# Patient Record
Sex: Female | Born: 1945 | Race: White | Hispanic: No | Smoking: Former smoker
Health system: Southern US, Community
[De-identification: ages and names within clinical notes are randomized; demographics above are authoritative.]

## PROBLEM LIST (undated history)

## (undated) MED FILL — Ferumoxytol Inj 510 MG/17ML (30 MG/ML) (Elemental Fe): INTRAVENOUS | Qty: 17 | Status: AC

---

## 2005-02-17 ENCOUNTER — Ambulatory Visit (HOSPITAL_COMMUNITY): Admission: RE | Admit: 2005-02-17 | Discharge: 2005-02-18 | Payer: Self-pay | Admitting: Neurological Surgery

## 2013-09-13 DIAGNOSIS — E119 Type 2 diabetes mellitus without complications: Secondary | ICD-10-CM | POA: Diagnosis not present

## 2013-09-13 DIAGNOSIS — Z23 Encounter for immunization: Secondary | ICD-10-CM | POA: Diagnosis not present

## 2013-09-13 DIAGNOSIS — E538 Deficiency of other specified B group vitamins: Secondary | ICD-10-CM | POA: Diagnosis not present

## 2013-09-13 DIAGNOSIS — D649 Anemia, unspecified: Secondary | ICD-10-CM | POA: Diagnosis not present

## 2013-09-20 DIAGNOSIS — Z1231 Encounter for screening mammogram for malignant neoplasm of breast: Secondary | ICD-10-CM | POA: Diagnosis not present

## 2013-10-16 DIAGNOSIS — I10 Essential (primary) hypertension: Secondary | ICD-10-CM | POA: Diagnosis not present

## 2013-10-16 DIAGNOSIS — F3289 Other specified depressive episodes: Secondary | ICD-10-CM | POA: Diagnosis not present

## 2013-10-16 DIAGNOSIS — M545 Low back pain, unspecified: Secondary | ICD-10-CM | POA: Diagnosis not present

## 2013-10-16 DIAGNOSIS — K2100 Gastro-esophageal reflux disease with esophagitis, without bleeding: Secondary | ICD-10-CM | POA: Diagnosis not present

## 2013-10-16 DIAGNOSIS — E538 Deficiency of other specified B group vitamins: Secondary | ICD-10-CM | POA: Diagnosis not present

## 2013-10-16 DIAGNOSIS — F329 Major depressive disorder, single episode, unspecified: Secondary | ICD-10-CM | POA: Diagnosis not present

## 2013-11-19 DIAGNOSIS — E538 Deficiency of other specified B group vitamins: Secondary | ICD-10-CM | POA: Diagnosis not present

## 2013-11-28 DIAGNOSIS — M2550 Pain in unspecified joint: Secondary | ICD-10-CM | POA: Diagnosis not present

## 2013-12-26 DIAGNOSIS — M25569 Pain in unspecified knee: Secondary | ICD-10-CM | POA: Diagnosis not present

## 2013-12-26 DIAGNOSIS — K2100 Gastro-esophageal reflux disease with esophagitis, without bleeding: Secondary | ICD-10-CM | POA: Diagnosis not present

## 2014-01-21 DIAGNOSIS — K1230 Oral mucositis (ulcerative), unspecified: Secondary | ICD-10-CM | POA: Diagnosis not present

## 2014-01-21 DIAGNOSIS — E785 Hyperlipidemia, unspecified: Secondary | ICD-10-CM | POA: Diagnosis not present

## 2014-01-21 DIAGNOSIS — I10 Essential (primary) hypertension: Secondary | ICD-10-CM | POA: Diagnosis not present

## 2014-01-21 DIAGNOSIS — Z9181 History of falling: Secondary | ICD-10-CM | POA: Diagnosis not present

## 2014-01-21 DIAGNOSIS — E538 Deficiency of other specified B group vitamins: Secondary | ICD-10-CM | POA: Diagnosis not present

## 2014-01-21 DIAGNOSIS — K121 Other forms of stomatitis: Secondary | ICD-10-CM | POA: Diagnosis not present

## 2014-01-21 DIAGNOSIS — E119 Type 2 diabetes mellitus without complications: Secondary | ICD-10-CM | POA: Diagnosis not present

## 2014-01-21 DIAGNOSIS — F329 Major depressive disorder, single episode, unspecified: Secondary | ICD-10-CM | POA: Diagnosis not present

## 2014-01-21 DIAGNOSIS — Z1331 Encounter for screening for depression: Secondary | ICD-10-CM | POA: Diagnosis not present

## 2014-01-21 DIAGNOSIS — F3289 Other specified depressive episodes: Secondary | ICD-10-CM | POA: Diagnosis not present

## 2014-01-29 DIAGNOSIS — IMO0002 Reserved for concepts with insufficient information to code with codable children: Secondary | ICD-10-CM | POA: Diagnosis not present

## 2014-01-29 DIAGNOSIS — M1710 Unilateral primary osteoarthritis, unspecified knee: Secondary | ICD-10-CM | POA: Diagnosis not present

## 2014-02-11 DIAGNOSIS — M1710 Unilateral primary osteoarthritis, unspecified knee: Secondary | ICD-10-CM | POA: Diagnosis not present

## 2014-02-11 DIAGNOSIS — IMO0002 Reserved for concepts with insufficient information to code with codable children: Secondary | ICD-10-CM | POA: Diagnosis not present

## 2014-02-19 DIAGNOSIS — IMO0002 Reserved for concepts with insufficient information to code with codable children: Secondary | ICD-10-CM | POA: Diagnosis not present

## 2014-02-19 DIAGNOSIS — M1710 Unilateral primary osteoarthritis, unspecified knee: Secondary | ICD-10-CM | POA: Diagnosis not present

## 2014-02-19 DIAGNOSIS — M1990 Unspecified osteoarthritis, unspecified site: Secondary | ICD-10-CM | POA: Diagnosis not present

## 2014-02-22 DIAGNOSIS — E538 Deficiency of other specified B group vitamins: Secondary | ICD-10-CM | POA: Diagnosis not present

## 2014-02-26 DIAGNOSIS — M1710 Unilateral primary osteoarthritis, unspecified knee: Secondary | ICD-10-CM | POA: Diagnosis not present

## 2014-03-04 DIAGNOSIS — J45902 Unspecified asthma with status asthmaticus: Secondary | ICD-10-CM | POA: Diagnosis not present

## 2014-03-04 DIAGNOSIS — J189 Pneumonia, unspecified organism: Secondary | ICD-10-CM | POA: Diagnosis not present

## 2014-03-04 DIAGNOSIS — Z23 Encounter for immunization: Secondary | ICD-10-CM | POA: Diagnosis not present

## 2014-03-07 DIAGNOSIS — M1990 Unspecified osteoarthritis, unspecified site: Secondary | ICD-10-CM | POA: Diagnosis not present

## 2014-03-07 DIAGNOSIS — IMO0002 Reserved for concepts with insufficient information to code with codable children: Secondary | ICD-10-CM | POA: Diagnosis not present

## 2014-03-07 DIAGNOSIS — M1710 Unilateral primary osteoarthritis, unspecified knee: Secondary | ICD-10-CM | POA: Diagnosis not present

## 2014-03-26 DIAGNOSIS — E538 Deficiency of other specified B group vitamins: Secondary | ICD-10-CM | POA: Diagnosis not present

## 2014-04-10 DIAGNOSIS — M1990 Unspecified osteoarthritis, unspecified site: Secondary | ICD-10-CM | POA: Diagnosis not present

## 2014-04-10 DIAGNOSIS — M25569 Pain in unspecified knee: Secondary | ICD-10-CM | POA: Diagnosis not present

## 2014-04-22 DIAGNOSIS — Z79899 Other long term (current) drug therapy: Secondary | ICD-10-CM | POA: Diagnosis not present

## 2014-04-22 DIAGNOSIS — Z01818 Encounter for other preprocedural examination: Secondary | ICD-10-CM | POA: Diagnosis not present

## 2014-04-22 DIAGNOSIS — M1710 Unilateral primary osteoarthritis, unspecified knee: Secondary | ICD-10-CM | POA: Diagnosis not present

## 2014-04-22 DIAGNOSIS — Z01810 Encounter for preprocedural cardiovascular examination: Secondary | ICD-10-CM | POA: Diagnosis not present

## 2014-04-22 DIAGNOSIS — R52 Pain, unspecified: Secondary | ICD-10-CM | POA: Diagnosis not present

## 2014-04-22 DIAGNOSIS — IMO0002 Reserved for concepts with insufficient information to code with codable children: Secondary | ICD-10-CM | POA: Diagnosis not present

## 2014-04-22 DIAGNOSIS — M79609 Pain in unspecified limb: Secondary | ICD-10-CM | POA: Diagnosis not present

## 2014-04-22 DIAGNOSIS — Z01812 Encounter for preprocedural laboratory examination: Secondary | ICD-10-CM | POA: Diagnosis not present

## 2014-04-22 DIAGNOSIS — R9431 Abnormal electrocardiogram [ECG] [EKG]: Secondary | ICD-10-CM | POA: Diagnosis not present

## 2014-04-23 DIAGNOSIS — I10 Essential (primary) hypertension: Secondary | ICD-10-CM | POA: Diagnosis not present

## 2014-04-23 DIAGNOSIS — E119 Type 2 diabetes mellitus without complications: Secondary | ICD-10-CM | POA: Diagnosis not present

## 2014-04-23 DIAGNOSIS — E785 Hyperlipidemia, unspecified: Secondary | ICD-10-CM | POA: Diagnosis not present

## 2014-04-26 DIAGNOSIS — E538 Deficiency of other specified B group vitamins: Secondary | ICD-10-CM | POA: Diagnosis not present

## 2014-05-02 DIAGNOSIS — M25569 Pain in unspecified knee: Secondary | ICD-10-CM | POA: Diagnosis not present

## 2014-05-14 DIAGNOSIS — IMO0002 Reserved for concepts with insufficient information to code with codable children: Secondary | ICD-10-CM | POA: Diagnosis not present

## 2014-05-14 DIAGNOSIS — J9610 Chronic respiratory failure, unspecified whether with hypoxia or hypercapnia: Secondary | ICD-10-CM | POA: Diagnosis not present

## 2014-05-14 DIAGNOSIS — Z96659 Presence of unspecified artificial knee joint: Secondary | ICD-10-CM | POA: Diagnosis not present

## 2014-05-14 DIAGNOSIS — M1710 Unilateral primary osteoarthritis, unspecified knee: Secondary | ICD-10-CM | POA: Diagnosis not present

## 2014-05-14 DIAGNOSIS — F411 Generalized anxiety disorder: Secondary | ICD-10-CM | POA: Diagnosis not present

## 2014-05-14 DIAGNOSIS — G8918 Other acute postprocedural pain: Secondary | ICD-10-CM | POA: Diagnosis not present

## 2014-05-14 DIAGNOSIS — J690 Pneumonitis due to inhalation of food and vomit: Secondary | ICD-10-CM | POA: Diagnosis not present

## 2014-05-14 DIAGNOSIS — J156 Pneumonia due to other aerobic Gram-negative bacteria: Secondary | ICD-10-CM | POA: Diagnosis not present

## 2014-05-14 DIAGNOSIS — R059 Cough, unspecified: Secondary | ICD-10-CM | POA: Diagnosis not present

## 2014-05-14 DIAGNOSIS — J189 Pneumonia, unspecified organism: Secondary | ICD-10-CM | POA: Diagnosis not present

## 2014-05-14 DIAGNOSIS — K219 Gastro-esophageal reflux disease without esophagitis: Secondary | ICD-10-CM | POA: Diagnosis not present

## 2014-05-14 DIAGNOSIS — G4733 Obstructive sleep apnea (adult) (pediatric): Secondary | ICD-10-CM | POA: Diagnosis not present

## 2014-05-14 DIAGNOSIS — Z471 Aftercare following joint replacement surgery: Secondary | ICD-10-CM | POA: Diagnosis not present

## 2014-05-14 DIAGNOSIS — R05 Cough: Secondary | ICD-10-CM | POA: Diagnosis not present

## 2014-05-14 DIAGNOSIS — I119 Hypertensive heart disease without heart failure: Secondary | ICD-10-CM | POA: Diagnosis not present

## 2014-05-14 DIAGNOSIS — R918 Other nonspecific abnormal finding of lung field: Secondary | ICD-10-CM | POA: Diagnosis not present

## 2014-05-14 DIAGNOSIS — R5381 Other malaise: Secondary | ICD-10-CM | POA: Diagnosis present

## 2014-05-14 DIAGNOSIS — J449 Chronic obstructive pulmonary disease, unspecified: Secondary | ICD-10-CM | POA: Diagnosis present

## 2014-05-20 DIAGNOSIS — Z96659 Presence of unspecified artificial knee joint: Secondary | ICD-10-CM | POA: Diagnosis not present

## 2014-05-20 DIAGNOSIS — IMO0001 Reserved for inherently not codable concepts without codable children: Secondary | ICD-10-CM | POA: Diagnosis not present

## 2014-05-20 DIAGNOSIS — E119 Type 2 diabetes mellitus without complications: Secondary | ICD-10-CM | POA: Diagnosis not present

## 2014-05-20 DIAGNOSIS — Z471 Aftercare following joint replacement surgery: Secondary | ICD-10-CM | POA: Diagnosis not present

## 2014-05-20 DIAGNOSIS — M6281 Muscle weakness (generalized): Secondary | ICD-10-CM | POA: Diagnosis not present

## 2014-05-20 DIAGNOSIS — R262 Difficulty in walking, not elsewhere classified: Secondary | ICD-10-CM | POA: Diagnosis not present

## 2014-05-21 DIAGNOSIS — E119 Type 2 diabetes mellitus without complications: Secondary | ICD-10-CM | POA: Diagnosis not present

## 2014-05-21 DIAGNOSIS — M6281 Muscle weakness (generalized): Secondary | ICD-10-CM | POA: Diagnosis not present

## 2014-05-21 DIAGNOSIS — IMO0001 Reserved for inherently not codable concepts without codable children: Secondary | ICD-10-CM | POA: Diagnosis not present

## 2014-05-21 DIAGNOSIS — Z96659 Presence of unspecified artificial knee joint: Secondary | ICD-10-CM | POA: Diagnosis not present

## 2014-05-21 DIAGNOSIS — Z471 Aftercare following joint replacement surgery: Secondary | ICD-10-CM | POA: Diagnosis not present

## 2014-05-21 DIAGNOSIS — R262 Difficulty in walking, not elsewhere classified: Secondary | ICD-10-CM | POA: Diagnosis not present

## 2014-05-22 DIAGNOSIS — R262 Difficulty in walking, not elsewhere classified: Secondary | ICD-10-CM | POA: Diagnosis not present

## 2014-05-22 DIAGNOSIS — IMO0001 Reserved for inherently not codable concepts without codable children: Secondary | ICD-10-CM | POA: Diagnosis not present

## 2014-05-22 DIAGNOSIS — E119 Type 2 diabetes mellitus without complications: Secondary | ICD-10-CM | POA: Diagnosis not present

## 2014-05-22 DIAGNOSIS — Z471 Aftercare following joint replacement surgery: Secondary | ICD-10-CM | POA: Diagnosis not present

## 2014-05-22 DIAGNOSIS — Z96659 Presence of unspecified artificial knee joint: Secondary | ICD-10-CM | POA: Diagnosis not present

## 2014-05-22 DIAGNOSIS — M6281 Muscle weakness (generalized): Secondary | ICD-10-CM | POA: Diagnosis not present

## 2014-05-23 DIAGNOSIS — Z96659 Presence of unspecified artificial knee joint: Secondary | ICD-10-CM | POA: Diagnosis not present

## 2014-05-23 DIAGNOSIS — M6281 Muscle weakness (generalized): Secondary | ICD-10-CM | POA: Diagnosis not present

## 2014-05-23 DIAGNOSIS — Z471 Aftercare following joint replacement surgery: Secondary | ICD-10-CM | POA: Diagnosis not present

## 2014-05-23 DIAGNOSIS — E119 Type 2 diabetes mellitus without complications: Secondary | ICD-10-CM | POA: Diagnosis not present

## 2014-05-23 DIAGNOSIS — R262 Difficulty in walking, not elsewhere classified: Secondary | ICD-10-CM | POA: Diagnosis not present

## 2014-05-23 DIAGNOSIS — IMO0001 Reserved for inherently not codable concepts without codable children: Secondary | ICD-10-CM | POA: Diagnosis not present

## 2014-05-24 DIAGNOSIS — Z471 Aftercare following joint replacement surgery: Secondary | ICD-10-CM | POA: Diagnosis not present

## 2014-05-24 DIAGNOSIS — IMO0001 Reserved for inherently not codable concepts without codable children: Secondary | ICD-10-CM | POA: Diagnosis not present

## 2014-05-24 DIAGNOSIS — E119 Type 2 diabetes mellitus without complications: Secondary | ICD-10-CM | POA: Diagnosis not present

## 2014-05-24 DIAGNOSIS — R262 Difficulty in walking, not elsewhere classified: Secondary | ICD-10-CM | POA: Diagnosis not present

## 2014-05-24 DIAGNOSIS — M6281 Muscle weakness (generalized): Secondary | ICD-10-CM | POA: Diagnosis not present

## 2014-05-24 DIAGNOSIS — Z96659 Presence of unspecified artificial knee joint: Secondary | ICD-10-CM | POA: Diagnosis not present

## 2014-05-27 DIAGNOSIS — Z471 Aftercare following joint replacement surgery: Secondary | ICD-10-CM | POA: Diagnosis not present

## 2014-05-27 DIAGNOSIS — R262 Difficulty in walking, not elsewhere classified: Secondary | ICD-10-CM | POA: Diagnosis not present

## 2014-05-27 DIAGNOSIS — Z96659 Presence of unspecified artificial knee joint: Secondary | ICD-10-CM | POA: Diagnosis not present

## 2014-05-27 DIAGNOSIS — M6281 Muscle weakness (generalized): Secondary | ICD-10-CM | POA: Diagnosis not present

## 2014-05-27 DIAGNOSIS — IMO0001 Reserved for inherently not codable concepts without codable children: Secondary | ICD-10-CM | POA: Diagnosis not present

## 2014-05-27 DIAGNOSIS — E119 Type 2 diabetes mellitus without complications: Secondary | ICD-10-CM | POA: Diagnosis not present

## 2014-05-28 DIAGNOSIS — Z96659 Presence of unspecified artificial knee joint: Secondary | ICD-10-CM | POA: Diagnosis not present

## 2014-05-28 DIAGNOSIS — M6281 Muscle weakness (generalized): Secondary | ICD-10-CM | POA: Diagnosis not present

## 2014-05-28 DIAGNOSIS — R262 Difficulty in walking, not elsewhere classified: Secondary | ICD-10-CM | POA: Diagnosis not present

## 2014-05-28 DIAGNOSIS — Z471 Aftercare following joint replacement surgery: Secondary | ICD-10-CM | POA: Diagnosis not present

## 2014-05-28 DIAGNOSIS — IMO0001 Reserved for inherently not codable concepts without codable children: Secondary | ICD-10-CM | POA: Diagnosis not present

## 2014-05-28 DIAGNOSIS — E119 Type 2 diabetes mellitus without complications: Secondary | ICD-10-CM | POA: Diagnosis not present

## 2014-05-29 DIAGNOSIS — R262 Difficulty in walking, not elsewhere classified: Secondary | ICD-10-CM | POA: Diagnosis not present

## 2014-05-29 DIAGNOSIS — Z471 Aftercare following joint replacement surgery: Secondary | ICD-10-CM | POA: Diagnosis not present

## 2014-05-29 DIAGNOSIS — M6281 Muscle weakness (generalized): Secondary | ICD-10-CM | POA: Diagnosis not present

## 2014-05-29 DIAGNOSIS — E119 Type 2 diabetes mellitus without complications: Secondary | ICD-10-CM | POA: Diagnosis not present

## 2014-05-29 DIAGNOSIS — Z96659 Presence of unspecified artificial knee joint: Secondary | ICD-10-CM | POA: Diagnosis not present

## 2014-05-29 DIAGNOSIS — IMO0001 Reserved for inherently not codable concepts without codable children: Secondary | ICD-10-CM | POA: Diagnosis not present

## 2014-05-31 DIAGNOSIS — E119 Type 2 diabetes mellitus without complications: Secondary | ICD-10-CM | POA: Diagnosis not present

## 2014-05-31 DIAGNOSIS — M6281 Muscle weakness (generalized): Secondary | ICD-10-CM | POA: Diagnosis not present

## 2014-05-31 DIAGNOSIS — Z471 Aftercare following joint replacement surgery: Secondary | ICD-10-CM | POA: Diagnosis not present

## 2014-05-31 DIAGNOSIS — IMO0001 Reserved for inherently not codable concepts without codable children: Secondary | ICD-10-CM | POA: Diagnosis not present

## 2014-05-31 DIAGNOSIS — Z96659 Presence of unspecified artificial knee joint: Secondary | ICD-10-CM | POA: Diagnosis not present

## 2014-05-31 DIAGNOSIS — R262 Difficulty in walking, not elsewhere classified: Secondary | ICD-10-CM | POA: Diagnosis not present

## 2014-06-11 DIAGNOSIS — M1990 Unspecified osteoarthritis, unspecified site: Secondary | ICD-10-CM | POA: Diagnosis not present

## 2014-06-17 DIAGNOSIS — M1990 Unspecified osteoarthritis, unspecified site: Secondary | ICD-10-CM | POA: Diagnosis not present

## 2014-06-19 DIAGNOSIS — M1990 Unspecified osteoarthritis, unspecified site: Secondary | ICD-10-CM | POA: Diagnosis not present

## 2014-06-21 DIAGNOSIS — M1990 Unspecified osteoarthritis, unspecified site: Secondary | ICD-10-CM | POA: Diagnosis not present

## 2014-06-24 DIAGNOSIS — Z96659 Presence of unspecified artificial knee joint: Secondary | ICD-10-CM | POA: Diagnosis not present

## 2014-07-01 DIAGNOSIS — K2100 Gastro-esophageal reflux disease with esophagitis, without bleeding: Secondary | ICD-10-CM | POA: Diagnosis not present

## 2014-07-01 DIAGNOSIS — E538 Deficiency of other specified B group vitamins: Secondary | ICD-10-CM | POA: Diagnosis not present

## 2014-07-01 DIAGNOSIS — R110 Nausea: Secondary | ICD-10-CM | POA: Diagnosis not present

## 2014-07-01 DIAGNOSIS — I10 Essential (primary) hypertension: Secondary | ICD-10-CM | POA: Diagnosis not present

## 2014-07-03 DIAGNOSIS — M1990 Unspecified osteoarthritis, unspecified site: Secondary | ICD-10-CM | POA: Diagnosis not present

## 2014-07-05 DIAGNOSIS — M1990 Unspecified osteoarthritis, unspecified site: Secondary | ICD-10-CM | POA: Diagnosis not present

## 2014-07-10 DIAGNOSIS — M1990 Unspecified osteoarthritis, unspecified site: Secondary | ICD-10-CM | POA: Diagnosis not present

## 2014-07-12 DIAGNOSIS — M1990 Unspecified osteoarthritis, unspecified site: Secondary | ICD-10-CM | POA: Diagnosis not present

## 2014-07-17 DIAGNOSIS — M1990 Unspecified osteoarthritis, unspecified site: Secondary | ICD-10-CM | POA: Diagnosis not present

## 2014-07-19 DIAGNOSIS — M1990 Unspecified osteoarthritis, unspecified site: Secondary | ICD-10-CM | POA: Diagnosis not present

## 2014-07-26 DIAGNOSIS — M1990 Unspecified osteoarthritis, unspecified site: Secondary | ICD-10-CM | POA: Diagnosis not present

## 2014-08-01 DIAGNOSIS — M792 Neuralgia and neuritis, unspecified: Secondary | ICD-10-CM | POA: Diagnosis not present

## 2014-08-01 DIAGNOSIS — E785 Hyperlipidemia, unspecified: Secondary | ICD-10-CM | POA: Diagnosis not present

## 2014-08-01 DIAGNOSIS — E538 Deficiency of other specified B group vitamins: Secondary | ICD-10-CM | POA: Diagnosis not present

## 2014-08-01 DIAGNOSIS — Z23 Encounter for immunization: Secondary | ICD-10-CM | POA: Diagnosis not present

## 2014-08-01 DIAGNOSIS — E119 Type 2 diabetes mellitus without complications: Secondary | ICD-10-CM | POA: Diagnosis not present

## 2014-08-05 DIAGNOSIS — Z96651 Presence of right artificial knee joint: Secondary | ICD-10-CM | POA: Diagnosis not present

## 2014-08-05 DIAGNOSIS — M25561 Pain in right knee: Secondary | ICD-10-CM | POA: Diagnosis not present

## 2014-08-07 DIAGNOSIS — M1711 Unilateral primary osteoarthritis, right knee: Secondary | ICD-10-CM | POA: Diagnosis not present

## 2014-08-07 DIAGNOSIS — R2689 Other abnormalities of gait and mobility: Secondary | ICD-10-CM | POA: Diagnosis not present

## 2014-08-07 DIAGNOSIS — M25561 Pain in right knee: Secondary | ICD-10-CM | POA: Diagnosis not present

## 2014-09-03 DIAGNOSIS — E538 Deficiency of other specified B group vitamins: Secondary | ICD-10-CM | POA: Diagnosis not present

## 2014-10-04 DIAGNOSIS — E538 Deficiency of other specified B group vitamins: Secondary | ICD-10-CM | POA: Diagnosis not present

## 2014-11-05 DIAGNOSIS — E538 Deficiency of other specified B group vitamins: Secondary | ICD-10-CM | POA: Diagnosis not present

## 2014-12-09 DIAGNOSIS — E538 Deficiency of other specified B group vitamins: Secondary | ICD-10-CM | POA: Diagnosis not present

## 2014-12-20 DIAGNOSIS — J45902 Unspecified asthma with status asthmaticus: Secondary | ICD-10-CM | POA: Diagnosis not present

## 2014-12-20 DIAGNOSIS — J189 Pneumonia, unspecified organism: Secondary | ICD-10-CM | POA: Diagnosis not present

## 2015-01-08 DIAGNOSIS — I10 Essential (primary) hypertension: Secondary | ICD-10-CM | POA: Diagnosis not present

## 2015-01-08 DIAGNOSIS — F329 Major depressive disorder, single episode, unspecified: Secondary | ICD-10-CM | POA: Diagnosis not present

## 2015-01-08 DIAGNOSIS — Z6841 Body Mass Index (BMI) 40.0 and over, adult: Secondary | ICD-10-CM | POA: Diagnosis not present

## 2015-01-08 DIAGNOSIS — E539 Vitamin B deficiency, unspecified: Secondary | ICD-10-CM | POA: Diagnosis not present

## 2015-01-08 DIAGNOSIS — K210 Gastro-esophageal reflux disease with esophagitis: Secondary | ICD-10-CM | POA: Diagnosis not present

## 2015-01-08 DIAGNOSIS — E785 Hyperlipidemia, unspecified: Secondary | ICD-10-CM | POA: Diagnosis not present

## 2015-01-08 DIAGNOSIS — E119 Type 2 diabetes mellitus without complications: Secondary | ICD-10-CM | POA: Diagnosis not present

## 2015-01-08 DIAGNOSIS — E538 Deficiency of other specified B group vitamins: Secondary | ICD-10-CM | POA: Diagnosis not present

## 2015-01-08 DIAGNOSIS — M792 Neuralgia and neuritis, unspecified: Secondary | ICD-10-CM | POA: Diagnosis not present

## 2015-01-08 DIAGNOSIS — G4700 Insomnia, unspecified: Secondary | ICD-10-CM | POA: Diagnosis not present

## 2015-01-14 DIAGNOSIS — Z1231 Encounter for screening mammogram for malignant neoplasm of breast: Secondary | ICD-10-CM | POA: Diagnosis not present

## 2015-07-15 DIAGNOSIS — K210 Gastro-esophageal reflux disease with esophagitis: Secondary | ICD-10-CM | POA: Diagnosis not present

## 2015-07-15 DIAGNOSIS — Z9181 History of falling: Secondary | ICD-10-CM | POA: Diagnosis not present

## 2015-07-15 DIAGNOSIS — F329 Major depressive disorder, single episode, unspecified: Secondary | ICD-10-CM | POA: Diagnosis not present

## 2015-07-15 DIAGNOSIS — E538 Deficiency of other specified B group vitamins: Secondary | ICD-10-CM | POA: Diagnosis not present

## 2015-07-15 DIAGNOSIS — E119 Type 2 diabetes mellitus without complications: Secondary | ICD-10-CM | POA: Diagnosis not present

## 2015-07-15 DIAGNOSIS — M545 Low back pain: Secondary | ICD-10-CM | POA: Diagnosis not present

## 2015-07-15 DIAGNOSIS — G4700 Insomnia, unspecified: Secondary | ICD-10-CM | POA: Diagnosis not present

## 2015-07-15 DIAGNOSIS — M818 Other osteoporosis without current pathological fracture: Secondary | ICD-10-CM | POA: Diagnosis not present

## 2015-07-15 DIAGNOSIS — E785 Hyperlipidemia, unspecified: Secondary | ICD-10-CM | POA: Diagnosis not present

## 2015-08-20 DIAGNOSIS — Z23 Encounter for immunization: Secondary | ICD-10-CM | POA: Diagnosis not present

## 2015-08-20 DIAGNOSIS — K219 Gastro-esophageal reflux disease without esophagitis: Secondary | ICD-10-CM | POA: Diagnosis not present

## 2015-08-20 DIAGNOSIS — R1013 Epigastric pain: Secondary | ICD-10-CM | POA: Diagnosis not present

## 2015-08-20 DIAGNOSIS — E538 Deficiency of other specified B group vitamins: Secondary | ICD-10-CM | POA: Diagnosis not present

## 2015-08-20 DIAGNOSIS — R1310 Dysphagia, unspecified: Secondary | ICD-10-CM | POA: Diagnosis not present

## 2015-09-01 DIAGNOSIS — K5730 Diverticulosis of large intestine without perforation or abscess without bleeding: Secondary | ICD-10-CM | POA: Diagnosis not present

## 2015-09-01 DIAGNOSIS — K219 Gastro-esophageal reflux disease without esophagitis: Secondary | ICD-10-CM | POA: Diagnosis not present

## 2015-09-05 DIAGNOSIS — K219 Gastro-esophageal reflux disease without esophagitis: Secondary | ICD-10-CM | POA: Diagnosis not present

## 2015-09-16 DIAGNOSIS — Z9884 Bariatric surgery status: Secondary | ICD-10-CM | POA: Diagnosis not present

## 2015-09-16 DIAGNOSIS — E119 Type 2 diabetes mellitus without complications: Secondary | ICD-10-CM | POA: Diagnosis not present

## 2015-09-16 DIAGNOSIS — F329 Major depressive disorder, single episode, unspecified: Secondary | ICD-10-CM | POA: Diagnosis not present

## 2015-09-16 DIAGNOSIS — I10 Essential (primary) hypertension: Secondary | ICD-10-CM | POA: Diagnosis not present

## 2015-09-16 DIAGNOSIS — K219 Gastro-esophageal reflux disease without esophagitis: Secondary | ICD-10-CM | POA: Diagnosis not present

## 2015-09-16 DIAGNOSIS — K2270 Barrett's esophagus without dysplasia: Secondary | ICD-10-CM | POA: Diagnosis not present

## 2015-09-16 DIAGNOSIS — Z9049 Acquired absence of other specified parts of digestive tract: Secondary | ICD-10-CM | POA: Diagnosis not present

## 2015-09-16 DIAGNOSIS — R1310 Dysphagia, unspecified: Secondary | ICD-10-CM | POA: Diagnosis not present

## 2015-09-16 DIAGNOSIS — J45909 Unspecified asthma, uncomplicated: Secondary | ICD-10-CM | POA: Diagnosis not present

## 2015-10-29 DIAGNOSIS — R05 Cough: Secondary | ICD-10-CM | POA: Diagnosis not present

## 2015-10-29 DIAGNOSIS — J45902 Unspecified asthma with status asthmaticus: Secondary | ICD-10-CM | POA: Diagnosis not present

## 2015-10-29 DIAGNOSIS — J189 Pneumonia, unspecified organism: Secondary | ICD-10-CM | POA: Diagnosis not present

## 2015-10-29 DIAGNOSIS — E785 Hyperlipidemia, unspecified: Secondary | ICD-10-CM | POA: Diagnosis not present

## 2015-10-29 DIAGNOSIS — E119 Type 2 diabetes mellitus without complications: Secondary | ICD-10-CM | POA: Diagnosis not present

## 2016-02-17 DIAGNOSIS — E785 Hyperlipidemia, unspecified: Secondary | ICD-10-CM | POA: Diagnosis not present

## 2016-02-17 DIAGNOSIS — K210 Gastro-esophageal reflux disease with esophagitis: Secondary | ICD-10-CM | POA: Diagnosis not present

## 2016-02-17 DIAGNOSIS — Z79899 Other long term (current) drug therapy: Secondary | ICD-10-CM | POA: Diagnosis not present

## 2016-02-17 DIAGNOSIS — I10 Essential (primary) hypertension: Secondary | ICD-10-CM | POA: Diagnosis not present

## 2016-02-17 DIAGNOSIS — M792 Neuralgia and neuritis, unspecified: Secondary | ICD-10-CM | POA: Diagnosis not present

## 2016-02-17 DIAGNOSIS — M545 Low back pain: Secondary | ICD-10-CM | POA: Diagnosis not present

## 2016-02-17 DIAGNOSIS — M25521 Pain in right elbow: Secondary | ICD-10-CM | POA: Diagnosis not present

## 2016-02-17 DIAGNOSIS — Z6841 Body Mass Index (BMI) 40.0 and over, adult: Secondary | ICD-10-CM | POA: Diagnosis not present

## 2016-02-17 DIAGNOSIS — F329 Major depressive disorder, single episode, unspecified: Secondary | ICD-10-CM | POA: Diagnosis not present

## 2016-02-17 DIAGNOSIS — E538 Deficiency of other specified B group vitamins: Secondary | ICD-10-CM | POA: Diagnosis not present

## 2016-02-17 DIAGNOSIS — E559 Vitamin D deficiency, unspecified: Secondary | ICD-10-CM | POA: Diagnosis not present

## 2016-02-17 DIAGNOSIS — E119 Type 2 diabetes mellitus without complications: Secondary | ICD-10-CM | POA: Diagnosis not present

## 2016-02-25 DIAGNOSIS — M7701 Medial epicondylitis, right elbow: Secondary | ICD-10-CM | POA: Diagnosis not present

## 2016-03-04 DIAGNOSIS — M25621 Stiffness of right elbow, not elsewhere classified: Secondary | ICD-10-CM | POA: Diagnosis not present

## 2016-03-04 DIAGNOSIS — M25521 Pain in right elbow: Secondary | ICD-10-CM | POA: Diagnosis not present

## 2016-03-04 DIAGNOSIS — M7711 Lateral epicondylitis, right elbow: Secondary | ICD-10-CM | POA: Diagnosis not present

## 2016-03-10 DIAGNOSIS — M25521 Pain in right elbow: Secondary | ICD-10-CM | POA: Diagnosis not present

## 2016-03-10 DIAGNOSIS — M25621 Stiffness of right elbow, not elsewhere classified: Secondary | ICD-10-CM | POA: Diagnosis not present

## 2016-03-10 DIAGNOSIS — M7711 Lateral epicondylitis, right elbow: Secondary | ICD-10-CM | POA: Diagnosis not present

## 2016-03-18 DIAGNOSIS — M25521 Pain in right elbow: Secondary | ICD-10-CM | POA: Diagnosis not present

## 2016-03-18 DIAGNOSIS — M7711 Lateral epicondylitis, right elbow: Secondary | ICD-10-CM | POA: Diagnosis not present

## 2016-03-18 DIAGNOSIS — M25621 Stiffness of right elbow, not elsewhere classified: Secondary | ICD-10-CM | POA: Diagnosis not present

## 2016-03-23 DIAGNOSIS — M25521 Pain in right elbow: Secondary | ICD-10-CM | POA: Diagnosis not present

## 2016-03-23 DIAGNOSIS — M25621 Stiffness of right elbow, not elsewhere classified: Secondary | ICD-10-CM | POA: Diagnosis not present

## 2016-03-23 DIAGNOSIS — M7711 Lateral epicondylitis, right elbow: Secondary | ICD-10-CM | POA: Diagnosis not present

## 2016-03-24 DIAGNOSIS — M25621 Stiffness of right elbow, not elsewhere classified: Secondary | ICD-10-CM | POA: Diagnosis not present

## 2016-03-24 DIAGNOSIS — M7711 Lateral epicondylitis, right elbow: Secondary | ICD-10-CM | POA: Diagnosis not present

## 2016-03-24 DIAGNOSIS — M25521 Pain in right elbow: Secondary | ICD-10-CM | POA: Diagnosis not present

## 2016-03-24 DIAGNOSIS — G5601 Carpal tunnel syndrome, right upper limb: Secondary | ICD-10-CM | POA: Diagnosis not present

## 2016-04-01 DIAGNOSIS — M25521 Pain in right elbow: Secondary | ICD-10-CM | POA: Diagnosis not present

## 2016-04-01 DIAGNOSIS — M25621 Stiffness of right elbow, not elsewhere classified: Secondary | ICD-10-CM | POA: Diagnosis not present

## 2016-04-01 DIAGNOSIS — M7711 Lateral epicondylitis, right elbow: Secondary | ICD-10-CM | POA: Diagnosis not present

## 2016-04-06 DIAGNOSIS — M25621 Stiffness of right elbow, not elsewhere classified: Secondary | ICD-10-CM | POA: Diagnosis not present

## 2016-04-06 DIAGNOSIS — M25521 Pain in right elbow: Secondary | ICD-10-CM | POA: Diagnosis not present

## 2016-04-06 DIAGNOSIS — M7711 Lateral epicondylitis, right elbow: Secondary | ICD-10-CM | POA: Diagnosis not present

## 2016-04-08 DIAGNOSIS — M25621 Stiffness of right elbow, not elsewhere classified: Secondary | ICD-10-CM | POA: Diagnosis not present

## 2016-04-08 DIAGNOSIS — M25521 Pain in right elbow: Secondary | ICD-10-CM | POA: Diagnosis not present

## 2016-04-08 DIAGNOSIS — M7711 Lateral epicondylitis, right elbow: Secondary | ICD-10-CM | POA: Diagnosis not present

## 2016-04-21 DIAGNOSIS — M7701 Medial epicondylitis, right elbow: Secondary | ICD-10-CM | POA: Diagnosis not present

## 2016-04-21 DIAGNOSIS — G5601 Carpal tunnel syndrome, right upper limb: Secondary | ICD-10-CM | POA: Diagnosis not present

## 2016-04-22 DIAGNOSIS — E538 Deficiency of other specified B group vitamins: Secondary | ICD-10-CM | POA: Diagnosis not present

## 2016-04-28 DIAGNOSIS — S56511A Strain of other extensor muscle, fascia and tendon at forearm level, right arm, initial encounter: Secondary | ICD-10-CM | POA: Diagnosis not present

## 2016-04-28 DIAGNOSIS — M7701 Medial epicondylitis, right elbow: Secondary | ICD-10-CM | POA: Diagnosis not present

## 2016-04-28 DIAGNOSIS — X58XXXA Exposure to other specified factors, initial encounter: Secondary | ICD-10-CM | POA: Diagnosis not present

## 2016-04-28 DIAGNOSIS — S56411A Strain of extensor muscle, fascia and tendon of right index finger at forearm level, initial encounter: Secondary | ICD-10-CM | POA: Diagnosis not present

## 2016-04-28 DIAGNOSIS — S56211A Strain of other flexor muscle, fascia and tendon at forearm level, right arm, initial encounter: Secondary | ICD-10-CM | POA: Diagnosis not present

## 2016-05-05 DIAGNOSIS — M7701 Medial epicondylitis, right elbow: Secondary | ICD-10-CM | POA: Diagnosis not present

## 2016-05-25 DIAGNOSIS — E119 Type 2 diabetes mellitus without complications: Secondary | ICD-10-CM | POA: Diagnosis not present

## 2016-05-25 DIAGNOSIS — Z1389 Encounter for screening for other disorder: Secondary | ICD-10-CM | POA: Diagnosis not present

## 2016-05-25 DIAGNOSIS — G4700 Insomnia, unspecified: Secondary | ICD-10-CM | POA: Diagnosis not present

## 2016-05-25 DIAGNOSIS — K210 Gastro-esophageal reflux disease with esophagitis: Secondary | ICD-10-CM | POA: Diagnosis not present

## 2016-05-25 DIAGNOSIS — I10 Essential (primary) hypertension: Secondary | ICD-10-CM | POA: Diagnosis not present

## 2016-05-25 DIAGNOSIS — E559 Vitamin D deficiency, unspecified: Secondary | ICD-10-CM | POA: Diagnosis not present

## 2016-05-25 DIAGNOSIS — E785 Hyperlipidemia, unspecified: Secondary | ICD-10-CM | POA: Diagnosis not present

## 2016-05-25 DIAGNOSIS — Z6841 Body Mass Index (BMI) 40.0 and over, adult: Secondary | ICD-10-CM | POA: Diagnosis not present

## 2016-05-25 DIAGNOSIS — F329 Major depressive disorder, single episode, unspecified: Secondary | ICD-10-CM | POA: Diagnosis not present

## 2016-05-25 DIAGNOSIS — E538 Deficiency of other specified B group vitamins: Secondary | ICD-10-CM | POA: Diagnosis not present

## 2016-05-25 DIAGNOSIS — M792 Neuralgia and neuritis, unspecified: Secondary | ICD-10-CM | POA: Diagnosis not present

## 2016-05-25 DIAGNOSIS — Z1231 Encounter for screening mammogram for malignant neoplasm of breast: Secondary | ICD-10-CM | POA: Diagnosis not present

## 2016-06-21 DIAGNOSIS — M79672 Pain in left foot: Secondary | ICD-10-CM | POA: Diagnosis not present

## 2016-06-21 DIAGNOSIS — S93602A Unspecified sprain of left foot, initial encounter: Secondary | ICD-10-CM | POA: Diagnosis not present

## 2016-06-28 DIAGNOSIS — E538 Deficiency of other specified B group vitamins: Secondary | ICD-10-CM | POA: Diagnosis not present

## 2016-07-21 DIAGNOSIS — S93601D Unspecified sprain of right foot, subsequent encounter: Secondary | ICD-10-CM | POA: Diagnosis not present

## 2016-07-29 DIAGNOSIS — Z1159 Encounter for screening for other viral diseases: Secondary | ICD-10-CM | POA: Diagnosis not present

## 2016-07-29 DIAGNOSIS — Z9181 History of falling: Secondary | ICD-10-CM | POA: Diagnosis not present

## 2016-07-29 DIAGNOSIS — Z6841 Body Mass Index (BMI) 40.0 and over, adult: Secondary | ICD-10-CM | POA: Diagnosis not present

## 2016-07-29 DIAGNOSIS — E785 Hyperlipidemia, unspecified: Secondary | ICD-10-CM | POA: Diagnosis not present

## 2016-07-29 DIAGNOSIS — Z23 Encounter for immunization: Secondary | ICD-10-CM | POA: Diagnosis not present

## 2016-07-29 DIAGNOSIS — E538 Deficiency of other specified B group vitamins: Secondary | ICD-10-CM | POA: Diagnosis not present

## 2016-07-29 DIAGNOSIS — L299 Pruritus, unspecified: Secondary | ICD-10-CM | POA: Diagnosis not present

## 2016-08-18 DIAGNOSIS — R21 Rash and other nonspecific skin eruption: Secondary | ICD-10-CM | POA: Diagnosis not present

## 2016-08-18 DIAGNOSIS — E876 Hypokalemia: Secondary | ICD-10-CM | POA: Diagnosis not present

## 2016-08-18 DIAGNOSIS — Z6841 Body Mass Index (BMI) 40.0 and over, adult: Secondary | ICD-10-CM | POA: Diagnosis not present

## 2016-08-30 DIAGNOSIS — K210 Gastro-esophageal reflux disease with esophagitis: Secondary | ICD-10-CM | POA: Diagnosis not present

## 2016-08-30 DIAGNOSIS — J45909 Unspecified asthma, uncomplicated: Secondary | ICD-10-CM | POA: Diagnosis not present

## 2016-08-30 DIAGNOSIS — E119 Type 2 diabetes mellitus without complications: Secondary | ICD-10-CM | POA: Diagnosis not present

## 2016-08-30 DIAGNOSIS — M792 Neuralgia and neuritis, unspecified: Secondary | ICD-10-CM | POA: Diagnosis not present

## 2016-08-30 DIAGNOSIS — L299 Pruritus, unspecified: Secondary | ICD-10-CM | POA: Diagnosis not present

## 2016-08-30 DIAGNOSIS — E785 Hyperlipidemia, unspecified: Secondary | ICD-10-CM | POA: Diagnosis not present

## 2016-08-30 DIAGNOSIS — I10 Essential (primary) hypertension: Secondary | ICD-10-CM | POA: Diagnosis not present

## 2016-08-30 DIAGNOSIS — F419 Anxiety disorder, unspecified: Secondary | ICD-10-CM | POA: Diagnosis not present

## 2016-08-30 DIAGNOSIS — D649 Anemia, unspecified: Secondary | ICD-10-CM | POA: Diagnosis not present

## 2016-08-30 DIAGNOSIS — Z6841 Body Mass Index (BMI) 40.0 and over, adult: Secondary | ICD-10-CM | POA: Diagnosis not present

## 2016-08-30 DIAGNOSIS — G4700 Insomnia, unspecified: Secondary | ICD-10-CM | POA: Diagnosis not present

## 2016-08-30 DIAGNOSIS — E538 Deficiency of other specified B group vitamins: Secondary | ICD-10-CM | POA: Diagnosis not present

## 2016-09-14 DIAGNOSIS — Z9884 Bariatric surgery status: Secondary | ICD-10-CM | POA: Diagnosis not present

## 2016-09-14 DIAGNOSIS — K912 Postsurgical malabsorption, not elsewhere classified: Secondary | ICD-10-CM | POA: Diagnosis not present

## 2016-09-14 DIAGNOSIS — D509 Iron deficiency anemia, unspecified: Secondary | ICD-10-CM | POA: Diagnosis not present

## 2016-09-14 DIAGNOSIS — E538 Deficiency of other specified B group vitamins: Secondary | ICD-10-CM | POA: Diagnosis not present

## 2016-09-17 DIAGNOSIS — D509 Iron deficiency anemia, unspecified: Secondary | ICD-10-CM | POA: Diagnosis not present

## 2016-09-21 DIAGNOSIS — D509 Iron deficiency anemia, unspecified: Secondary | ICD-10-CM | POA: Diagnosis not present

## 2016-11-10 DIAGNOSIS — Z6841 Body Mass Index (BMI) 40.0 and over, adult: Secondary | ICD-10-CM | POA: Diagnosis not present

## 2016-11-10 DIAGNOSIS — J019 Acute sinusitis, unspecified: Secondary | ICD-10-CM | POA: Diagnosis not present

## 2016-11-10 DIAGNOSIS — J301 Allergic rhinitis due to pollen: Secondary | ICD-10-CM | POA: Diagnosis not present

## 2016-11-10 DIAGNOSIS — B349 Viral infection, unspecified: Secondary | ICD-10-CM | POA: Diagnosis not present

## 2016-11-23 DIAGNOSIS — E611 Iron deficiency: Secondary | ICD-10-CM | POA: Diagnosis not present

## 2016-11-23 DIAGNOSIS — Z9884 Bariatric surgery status: Secondary | ICD-10-CM | POA: Diagnosis not present

## 2016-12-01 DIAGNOSIS — M7989 Other specified soft tissue disorders: Secondary | ICD-10-CM | POA: Diagnosis not present

## 2016-12-01 DIAGNOSIS — Z6841 Body Mass Index (BMI) 40.0 and over, adult: Secondary | ICD-10-CM | POA: Diagnosis not present

## 2016-12-01 DIAGNOSIS — K210 Gastro-esophageal reflux disease with esophagitis: Secondary | ICD-10-CM | POA: Diagnosis not present

## 2016-12-01 DIAGNOSIS — E538 Deficiency of other specified B group vitamins: Secondary | ICD-10-CM | POA: Diagnosis not present

## 2016-12-01 DIAGNOSIS — I10 Essential (primary) hypertension: Secondary | ICD-10-CM | POA: Diagnosis not present

## 2016-12-01 DIAGNOSIS — E119 Type 2 diabetes mellitus without complications: Secondary | ICD-10-CM | POA: Diagnosis not present

## 2016-12-01 DIAGNOSIS — E785 Hyperlipidemia, unspecified: Secondary | ICD-10-CM | POA: Diagnosis not present

## 2016-12-01 DIAGNOSIS — F329 Major depressive disorder, single episode, unspecified: Secondary | ICD-10-CM | POA: Diagnosis not present

## 2016-12-03 DIAGNOSIS — M7989 Other specified soft tissue disorders: Secondary | ICD-10-CM | POA: Diagnosis not present

## 2016-12-03 DIAGNOSIS — M19041 Primary osteoarthritis, right hand: Secondary | ICD-10-CM | POA: Diagnosis not present

## 2016-12-06 DIAGNOSIS — R1310 Dysphagia, unspecified: Secondary | ICD-10-CM | POA: Diagnosis not present

## 2016-12-06 DIAGNOSIS — K5730 Diverticulosis of large intestine without perforation or abscess without bleeding: Secondary | ICD-10-CM | POA: Diagnosis not present

## 2016-12-06 DIAGNOSIS — K219 Gastro-esophageal reflux disease without esophagitis: Secondary | ICD-10-CM | POA: Diagnosis not present

## 2016-12-07 DIAGNOSIS — M7989 Other specified soft tissue disorders: Secondary | ICD-10-CM | POA: Diagnosis not present

## 2016-12-07 DIAGNOSIS — Z6841 Body Mass Index (BMI) 40.0 and over, adult: Secondary | ICD-10-CM | POA: Diagnosis not present

## 2016-12-17 DIAGNOSIS — K2270 Barrett's esophagus without dysplasia: Secondary | ICD-10-CM | POA: Diagnosis not present

## 2016-12-17 DIAGNOSIS — I10 Essential (primary) hypertension: Secondary | ICD-10-CM | POA: Diagnosis not present

## 2016-12-17 DIAGNOSIS — R1310 Dysphagia, unspecified: Secondary | ICD-10-CM | POA: Diagnosis not present

## 2016-12-17 DIAGNOSIS — E119 Type 2 diabetes mellitus without complications: Secondary | ICD-10-CM | POA: Diagnosis not present

## 2016-12-17 DIAGNOSIS — D649 Anemia, unspecified: Secondary | ICD-10-CM | POA: Diagnosis not present

## 2016-12-17 DIAGNOSIS — Z9049 Acquired absence of other specified parts of digestive tract: Secondary | ICD-10-CM | POA: Diagnosis not present

## 2016-12-17 DIAGNOSIS — Z79899 Other long term (current) drug therapy: Secondary | ICD-10-CM | POA: Diagnosis not present

## 2016-12-17 DIAGNOSIS — Z9884 Bariatric surgery status: Secondary | ICD-10-CM | POA: Diagnosis not present

## 2016-12-17 DIAGNOSIS — K219 Gastro-esophageal reflux disease without esophagitis: Secondary | ICD-10-CM | POA: Diagnosis not present

## 2016-12-17 DIAGNOSIS — K319 Disease of stomach and duodenum, unspecified: Secondary | ICD-10-CM | POA: Diagnosis not present

## 2016-12-17 DIAGNOSIS — Z90710 Acquired absence of both cervix and uterus: Secondary | ICD-10-CM | POA: Diagnosis not present

## 2017-03-03 DIAGNOSIS — D509 Iron deficiency anemia, unspecified: Secondary | ICD-10-CM | POA: Diagnosis not present

## 2017-03-11 DIAGNOSIS — E538 Deficiency of other specified B group vitamins: Secondary | ICD-10-CM | POA: Diagnosis not present

## 2017-03-11 DIAGNOSIS — E119 Type 2 diabetes mellitus without complications: Secondary | ICD-10-CM | POA: Diagnosis not present

## 2017-03-11 DIAGNOSIS — I10 Essential (primary) hypertension: Secondary | ICD-10-CM | POA: Diagnosis not present

## 2017-03-11 DIAGNOSIS — M545 Low back pain: Secondary | ICD-10-CM | POA: Diagnosis not present

## 2017-03-11 DIAGNOSIS — F329 Major depressive disorder, single episode, unspecified: Secondary | ICD-10-CM | POA: Diagnosis not present

## 2017-03-11 DIAGNOSIS — M7989 Other specified soft tissue disorders: Secondary | ICD-10-CM | POA: Diagnosis not present

## 2017-03-11 DIAGNOSIS — M818 Other osteoporosis without current pathological fracture: Secondary | ICD-10-CM | POA: Diagnosis not present

## 2017-03-11 DIAGNOSIS — E785 Hyperlipidemia, unspecified: Secondary | ICD-10-CM | POA: Diagnosis not present

## 2017-03-11 DIAGNOSIS — Z6841 Body Mass Index (BMI) 40.0 and over, adult: Secondary | ICD-10-CM | POA: Diagnosis not present

## 2017-03-11 DIAGNOSIS — G4700 Insomnia, unspecified: Secondary | ICD-10-CM | POA: Diagnosis not present

## 2017-04-15 DIAGNOSIS — E538 Deficiency of other specified B group vitamins: Secondary | ICD-10-CM | POA: Diagnosis not present

## 2017-05-17 DIAGNOSIS — R21 Rash and other nonspecific skin eruption: Secondary | ICD-10-CM | POA: Diagnosis not present

## 2017-05-17 DIAGNOSIS — E538 Deficiency of other specified B group vitamins: Secondary | ICD-10-CM | POA: Diagnosis not present

## 2017-05-17 DIAGNOSIS — Z6841 Body Mass Index (BMI) 40.0 and over, adult: Secondary | ICD-10-CM | POA: Diagnosis not present

## 2017-06-21 DIAGNOSIS — K210 Gastro-esophageal reflux disease with esophagitis: Secondary | ICD-10-CM | POA: Diagnosis not present

## 2017-06-21 DIAGNOSIS — M792 Neuralgia and neuritis, unspecified: Secondary | ICD-10-CM | POA: Diagnosis not present

## 2017-06-21 DIAGNOSIS — I10 Essential (primary) hypertension: Secondary | ICD-10-CM | POA: Diagnosis not present

## 2017-06-21 DIAGNOSIS — E785 Hyperlipidemia, unspecified: Secondary | ICD-10-CM | POA: Diagnosis not present

## 2017-06-21 DIAGNOSIS — M818 Other osteoporosis without current pathological fracture: Secondary | ICD-10-CM | POA: Diagnosis not present

## 2017-06-21 DIAGNOSIS — E119 Type 2 diabetes mellitus without complications: Secondary | ICD-10-CM | POA: Diagnosis not present

## 2017-06-21 DIAGNOSIS — Z6839 Body mass index (BMI) 39.0-39.9, adult: Secondary | ICD-10-CM | POA: Diagnosis not present

## 2017-06-21 DIAGNOSIS — E538 Deficiency of other specified B group vitamins: Secondary | ICD-10-CM | POA: Diagnosis not present

## 2017-06-21 DIAGNOSIS — E669 Obesity, unspecified: Secondary | ICD-10-CM | POA: Diagnosis not present

## 2017-06-21 DIAGNOSIS — F419 Anxiety disorder, unspecified: Secondary | ICD-10-CM | POA: Diagnosis not present

## 2017-06-21 DIAGNOSIS — J45909 Unspecified asthma, uncomplicated: Secondary | ICD-10-CM | POA: Diagnosis not present

## 2017-06-21 DIAGNOSIS — R110 Nausea: Secondary | ICD-10-CM | POA: Diagnosis not present

## 2017-06-28 DIAGNOSIS — I10 Essential (primary) hypertension: Secondary | ICD-10-CM | POA: Diagnosis not present

## 2017-06-28 DIAGNOSIS — Z6841 Body Mass Index (BMI) 40.0 and over, adult: Secondary | ICD-10-CM | POA: Diagnosis not present

## 2017-07-06 DIAGNOSIS — K222 Esophageal obstruction: Secondary | ICD-10-CM | POA: Diagnosis not present

## 2017-07-06 DIAGNOSIS — K2270 Barrett's esophagus without dysplasia: Secondary | ICD-10-CM | POA: Diagnosis not present

## 2017-07-06 DIAGNOSIS — D473 Essential (hemorrhagic) thrombocythemia: Secondary | ICD-10-CM | POA: Diagnosis not present

## 2017-07-06 DIAGNOSIS — D509 Iron deficiency anemia, unspecified: Secondary | ICD-10-CM | POA: Diagnosis not present

## 2017-07-06 DIAGNOSIS — B379 Candidiasis, unspecified: Secondary | ICD-10-CM | POA: Diagnosis not present

## 2017-07-06 DIAGNOSIS — E611 Iron deficiency: Secondary | ICD-10-CM | POA: Diagnosis not present

## 2017-07-06 DIAGNOSIS — E876 Hypokalemia: Secondary | ICD-10-CM | POA: Diagnosis not present

## 2017-07-22 DIAGNOSIS — R21 Rash and other nonspecific skin eruption: Secondary | ICD-10-CM | POA: Diagnosis not present

## 2017-07-22 DIAGNOSIS — B373 Candidiasis of vulva and vagina: Secondary | ICD-10-CM | POA: Diagnosis not present

## 2017-07-22 DIAGNOSIS — Z6841 Body Mass Index (BMI) 40.0 and over, adult: Secondary | ICD-10-CM | POA: Diagnosis not present

## 2017-07-22 DIAGNOSIS — E538 Deficiency of other specified B group vitamins: Secondary | ICD-10-CM | POA: Diagnosis not present

## 2017-08-04 DIAGNOSIS — D500 Iron deficiency anemia secondary to blood loss (chronic): Secondary | ICD-10-CM | POA: Diagnosis not present

## 2017-08-04 DIAGNOSIS — K5732 Diverticulitis of large intestine without perforation or abscess without bleeding: Secondary | ICD-10-CM | POA: Diagnosis not present

## 2017-08-04 DIAGNOSIS — K219 Gastro-esophageal reflux disease without esophagitis: Secondary | ICD-10-CM | POA: Diagnosis not present

## 2017-08-16 DIAGNOSIS — N898 Other specified noninflammatory disorders of vagina: Secondary | ICD-10-CM | POA: Diagnosis not present

## 2017-08-24 DIAGNOSIS — Z23 Encounter for immunization: Secondary | ICD-10-CM | POA: Diagnosis not present

## 2017-08-24 DIAGNOSIS — E538 Deficiency of other specified B group vitamins: Secondary | ICD-10-CM | POA: Diagnosis not present

## 2017-08-30 DIAGNOSIS — N898 Other specified noninflammatory disorders of vagina: Secondary | ICD-10-CM | POA: Diagnosis not present

## 2017-08-30 DIAGNOSIS — B379 Candidiasis, unspecified: Secondary | ICD-10-CM | POA: Diagnosis not present

## 2017-09-02 DIAGNOSIS — Z79899 Other long term (current) drug therapy: Secondary | ICD-10-CM | POA: Diagnosis not present

## 2017-09-02 DIAGNOSIS — D126 Benign neoplasm of colon, unspecified: Secondary | ICD-10-CM | POA: Diagnosis not present

## 2017-09-02 DIAGNOSIS — I10 Essential (primary) hypertension: Secondary | ICD-10-CM | POA: Diagnosis not present

## 2017-09-02 DIAGNOSIS — E119 Type 2 diabetes mellitus without complications: Secondary | ICD-10-CM | POA: Diagnosis not present

## 2017-09-02 DIAGNOSIS — D509 Iron deficiency anemia, unspecified: Secondary | ICD-10-CM | POA: Diagnosis not present

## 2017-09-02 DIAGNOSIS — D500 Iron deficiency anemia secondary to blood loss (chronic): Secondary | ICD-10-CM | POA: Diagnosis not present

## 2017-09-02 DIAGNOSIS — D124 Benign neoplasm of descending colon: Secondary | ICD-10-CM | POA: Diagnosis not present

## 2017-09-02 DIAGNOSIS — K5730 Diverticulosis of large intestine without perforation or abscess without bleeding: Secondary | ICD-10-CM | POA: Diagnosis not present

## 2017-09-02 DIAGNOSIS — J45909 Unspecified asthma, uncomplicated: Secondary | ICD-10-CM | POA: Diagnosis not present

## 2017-09-02 DIAGNOSIS — Z9884 Bariatric surgery status: Secondary | ICD-10-CM | POA: Diagnosis not present

## 2017-09-02 DIAGNOSIS — K648 Other hemorrhoids: Secondary | ICD-10-CM | POA: Diagnosis not present

## 2017-09-02 DIAGNOSIS — D122 Benign neoplasm of ascending colon: Secondary | ICD-10-CM | POA: Diagnosis not present

## 2017-09-02 DIAGNOSIS — Z7982 Long term (current) use of aspirin: Secondary | ICD-10-CM | POA: Diagnosis not present

## 2017-09-02 DIAGNOSIS — Z9049 Acquired absence of other specified parts of digestive tract: Secondary | ICD-10-CM | POA: Diagnosis not present

## 2017-09-02 DIAGNOSIS — K644 Residual hemorrhoidal skin tags: Secondary | ICD-10-CM | POA: Diagnosis not present

## 2017-09-27 DIAGNOSIS — E538 Deficiency of other specified B group vitamins: Secondary | ICD-10-CM | POA: Diagnosis not present

## 2017-09-27 DIAGNOSIS — K210 Gastro-esophageal reflux disease with esophagitis: Secondary | ICD-10-CM | POA: Diagnosis not present

## 2017-09-27 DIAGNOSIS — R21 Rash and other nonspecific skin eruption: Secondary | ICD-10-CM | POA: Diagnosis not present

## 2017-09-27 DIAGNOSIS — E876 Hypokalemia: Secondary | ICD-10-CM | POA: Diagnosis not present

## 2017-09-27 DIAGNOSIS — E785 Hyperlipidemia, unspecified: Secondary | ICD-10-CM | POA: Diagnosis not present

## 2017-09-27 DIAGNOSIS — I10 Essential (primary) hypertension: Secondary | ICD-10-CM | POA: Diagnosis not present

## 2017-09-27 DIAGNOSIS — E119 Type 2 diabetes mellitus without complications: Secondary | ICD-10-CM | POA: Diagnosis not present

## 2017-09-27 DIAGNOSIS — M25561 Pain in right knee: Secondary | ICD-10-CM | POA: Diagnosis not present

## 2017-09-27 DIAGNOSIS — F329 Major depressive disorder, single episode, unspecified: Secondary | ICD-10-CM | POA: Diagnosis not present

## 2017-09-27 DIAGNOSIS — M792 Neuralgia and neuritis, unspecified: Secondary | ICD-10-CM | POA: Diagnosis not present

## 2017-09-27 DIAGNOSIS — Z6841 Body Mass Index (BMI) 40.0 and over, adult: Secondary | ICD-10-CM | POA: Diagnosis not present

## 2017-10-13 DIAGNOSIS — D509 Iron deficiency anemia, unspecified: Secondary | ICD-10-CM | POA: Diagnosis not present

## 2017-10-13 DIAGNOSIS — Z9884 Bariatric surgery status: Secondary | ICD-10-CM | POA: Diagnosis not present

## 2017-10-13 DIAGNOSIS — E538 Deficiency of other specified B group vitamins: Secondary | ICD-10-CM | POA: Diagnosis not present

## 2017-10-17 DIAGNOSIS — E538 Deficiency of other specified B group vitamins: Secondary | ICD-10-CM | POA: Diagnosis not present

## 2017-10-17 DIAGNOSIS — D509 Iron deficiency anemia, unspecified: Secondary | ICD-10-CM | POA: Diagnosis not present

## 2017-10-17 DIAGNOSIS — Z9884 Bariatric surgery status: Secondary | ICD-10-CM | POA: Diagnosis not present

## 2017-10-24 DIAGNOSIS — D509 Iron deficiency anemia, unspecified: Secondary | ICD-10-CM | POA: Diagnosis not present

## 2017-10-28 DIAGNOSIS — E538 Deficiency of other specified B group vitamins: Secondary | ICD-10-CM | POA: Diagnosis not present

## 2017-11-28 DIAGNOSIS — D509 Iron deficiency anemia, unspecified: Secondary | ICD-10-CM | POA: Diagnosis not present

## 2017-11-28 DIAGNOSIS — E611 Iron deficiency: Secondary | ICD-10-CM | POA: Diagnosis not present

## 2017-11-28 DIAGNOSIS — Z9884 Bariatric surgery status: Secondary | ICD-10-CM | POA: Diagnosis not present

## 2017-11-29 DIAGNOSIS — Z1231 Encounter for screening mammogram for malignant neoplasm of breast: Secondary | ICD-10-CM | POA: Diagnosis not present

## 2017-12-06 DIAGNOSIS — E538 Deficiency of other specified B group vitamins: Secondary | ICD-10-CM | POA: Diagnosis not present

## 2017-12-14 DIAGNOSIS — N6489 Other specified disorders of breast: Secondary | ICD-10-CM | POA: Diagnosis not present

## 2017-12-14 DIAGNOSIS — R928 Other abnormal and inconclusive findings on diagnostic imaging of breast: Secondary | ICD-10-CM | POA: Diagnosis not present

## 2017-12-30 DIAGNOSIS — M1711 Unilateral primary osteoarthritis, right knee: Secondary | ICD-10-CM | POA: Diagnosis not present

## 2018-01-05 DIAGNOSIS — E538 Deficiency of other specified B group vitamins: Secondary | ICD-10-CM | POA: Diagnosis not present

## 2018-02-09 DIAGNOSIS — E538 Deficiency of other specified B group vitamins: Secondary | ICD-10-CM | POA: Diagnosis not present

## 2018-02-09 DIAGNOSIS — E559 Vitamin D deficiency, unspecified: Secondary | ICD-10-CM | POA: Diagnosis not present

## 2018-02-09 DIAGNOSIS — M545 Low back pain: Secondary | ICD-10-CM | POA: Diagnosis not present

## 2018-02-09 DIAGNOSIS — Z9181 History of falling: Secondary | ICD-10-CM | POA: Diagnosis not present

## 2018-02-09 DIAGNOSIS — M792 Neuralgia and neuritis, unspecified: Secondary | ICD-10-CM | POA: Diagnosis not present

## 2018-02-09 DIAGNOSIS — G43909 Migraine, unspecified, not intractable, without status migrainosus: Secondary | ICD-10-CM | POA: Diagnosis not present

## 2018-02-09 DIAGNOSIS — E785 Hyperlipidemia, unspecified: Secondary | ICD-10-CM | POA: Diagnosis not present

## 2018-02-09 DIAGNOSIS — E118 Type 2 diabetes mellitus with unspecified complications: Secondary | ICD-10-CM | POA: Diagnosis not present

## 2018-02-09 DIAGNOSIS — K210 Gastro-esophageal reflux disease with esophagitis: Secondary | ICD-10-CM | POA: Diagnosis not present

## 2018-02-09 DIAGNOSIS — Z6841 Body Mass Index (BMI) 40.0 and over, adult: Secondary | ICD-10-CM | POA: Diagnosis not present

## 2018-02-09 DIAGNOSIS — F329 Major depressive disorder, single episode, unspecified: Secondary | ICD-10-CM | POA: Diagnosis not present

## 2018-02-09 DIAGNOSIS — G4700 Insomnia, unspecified: Secondary | ICD-10-CM | POA: Diagnosis not present

## 2018-02-09 DIAGNOSIS — I10 Essential (primary) hypertension: Secondary | ICD-10-CM | POA: Diagnosis not present

## 2018-02-28 DIAGNOSIS — S9031XA Contusion of right foot, initial encounter: Secondary | ICD-10-CM | POA: Diagnosis not present

## 2018-03-02 DIAGNOSIS — M25571 Pain in right ankle and joints of right foot: Secondary | ICD-10-CM | POA: Diagnosis not present

## 2018-03-02 DIAGNOSIS — S9031XA Contusion of right foot, initial encounter: Secondary | ICD-10-CM | POA: Diagnosis not present

## 2018-03-02 DIAGNOSIS — S99921A Unspecified injury of right foot, initial encounter: Secondary | ICD-10-CM | POA: Diagnosis not present

## 2018-03-03 DIAGNOSIS — S9031XA Contusion of right foot, initial encounter: Secondary | ICD-10-CM | POA: Diagnosis not present

## 2018-03-23 DIAGNOSIS — E538 Deficiency of other specified B group vitamins: Secondary | ICD-10-CM | POA: Diagnosis not present

## 2018-04-04 DIAGNOSIS — M79671 Pain in right foot: Secondary | ICD-10-CM | POA: Diagnosis not present

## 2018-04-04 DIAGNOSIS — M7989 Other specified soft tissue disorders: Secondary | ICD-10-CM | POA: Diagnosis not present

## 2018-04-04 DIAGNOSIS — S9031XA Contusion of right foot, initial encounter: Secondary | ICD-10-CM | POA: Diagnosis not present

## 2018-04-06 DIAGNOSIS — M7989 Other specified soft tissue disorders: Secondary | ICD-10-CM | POA: Diagnosis not present

## 2018-04-06 DIAGNOSIS — M79671 Pain in right foot: Secondary | ICD-10-CM | POA: Diagnosis not present

## 2018-04-11 DIAGNOSIS — M79671 Pain in right foot: Secondary | ICD-10-CM | POA: Diagnosis not present

## 2018-04-11 DIAGNOSIS — S9031XA Contusion of right foot, initial encounter: Secondary | ICD-10-CM | POA: Diagnosis not present

## 2018-04-25 DIAGNOSIS — M109 Gout, unspecified: Secondary | ICD-10-CM | POA: Diagnosis not present

## 2018-04-25 DIAGNOSIS — E538 Deficiency of other specified B group vitamins: Secondary | ICD-10-CM | POA: Diagnosis not present

## 2018-04-25 DIAGNOSIS — Z6841 Body Mass Index (BMI) 40.0 and over, adult: Secondary | ICD-10-CM | POA: Diagnosis not present

## 2018-04-25 DIAGNOSIS — M545 Low back pain: Secondary | ICD-10-CM | POA: Diagnosis not present

## 2018-05-09 DIAGNOSIS — I10 Essential (primary) hypertension: Secondary | ICD-10-CM | POA: Diagnosis not present

## 2018-05-09 DIAGNOSIS — Z6841 Body Mass Index (BMI) 40.0 and over, adult: Secondary | ICD-10-CM | POA: Diagnosis not present

## 2018-05-09 DIAGNOSIS — M109 Gout, unspecified: Secondary | ICD-10-CM | POA: Diagnosis not present

## 2018-06-01 DIAGNOSIS — E538 Deficiency of other specified B group vitamins: Secondary | ICD-10-CM | POA: Diagnosis not present

## 2018-06-01 DIAGNOSIS — E785 Hyperlipidemia, unspecified: Secondary | ICD-10-CM | POA: Diagnosis not present

## 2018-06-01 DIAGNOSIS — K210 Gastro-esophageal reflux disease with esophagitis: Secondary | ICD-10-CM | POA: Diagnosis not present

## 2018-06-01 DIAGNOSIS — M109 Gout, unspecified: Secondary | ICD-10-CM | POA: Diagnosis not present

## 2018-06-01 DIAGNOSIS — E118 Type 2 diabetes mellitus with unspecified complications: Secondary | ICD-10-CM | POA: Diagnosis not present

## 2018-06-01 DIAGNOSIS — I10 Essential (primary) hypertension: Secondary | ICD-10-CM | POA: Diagnosis not present

## 2018-06-01 DIAGNOSIS — M818 Other osteoporosis without current pathological fracture: Secondary | ICD-10-CM | POA: Diagnosis not present

## 2018-07-04 DIAGNOSIS — Z111 Encounter for screening for respiratory tuberculosis: Secondary | ICD-10-CM | POA: Diagnosis not present

## 2018-07-04 DIAGNOSIS — E538 Deficiency of other specified B group vitamins: Secondary | ICD-10-CM | POA: Diagnosis not present

## 2018-07-25 DIAGNOSIS — B373 Candidiasis of vulva and vagina: Secondary | ICD-10-CM | POA: Diagnosis not present

## 2018-07-25 DIAGNOSIS — R21 Rash and other nonspecific skin eruption: Secondary | ICD-10-CM | POA: Diagnosis not present

## 2018-07-25 DIAGNOSIS — R300 Dysuria: Secondary | ICD-10-CM | POA: Diagnosis not present

## 2018-07-25 DIAGNOSIS — N390 Urinary tract infection, site not specified: Secondary | ICD-10-CM | POA: Diagnosis not present

## 2018-08-08 DIAGNOSIS — E538 Deficiency of other specified B group vitamins: Secondary | ICD-10-CM | POA: Diagnosis not present

## 2018-09-18 DIAGNOSIS — D649 Anemia, unspecified: Secondary | ICD-10-CM | POA: Diagnosis not present

## 2018-09-18 DIAGNOSIS — E118 Type 2 diabetes mellitus with unspecified complications: Secondary | ICD-10-CM | POA: Diagnosis not present

## 2018-09-18 DIAGNOSIS — E538 Deficiency of other specified B group vitamins: Secondary | ICD-10-CM | POA: Diagnosis not present

## 2018-09-18 DIAGNOSIS — E785 Hyperlipidemia, unspecified: Secondary | ICD-10-CM | POA: Diagnosis not present

## 2018-09-18 DIAGNOSIS — J019 Acute sinusitis, unspecified: Secondary | ICD-10-CM | POA: Diagnosis not present

## 2018-09-18 DIAGNOSIS — Z23 Encounter for immunization: Secondary | ICD-10-CM | POA: Diagnosis not present

## 2018-10-18 DIAGNOSIS — E538 Deficiency of other specified B group vitamins: Secondary | ICD-10-CM | POA: Diagnosis not present

## 2018-11-20 DIAGNOSIS — E538 Deficiency of other specified B group vitamins: Secondary | ICD-10-CM | POA: Diagnosis not present

## 2018-12-12 DIAGNOSIS — L299 Pruritus, unspecified: Secondary | ICD-10-CM | POA: Diagnosis not present

## 2018-12-12 DIAGNOSIS — Z1231 Encounter for screening mammogram for malignant neoplasm of breast: Secondary | ICD-10-CM | POA: Diagnosis not present

## 2018-12-12 DIAGNOSIS — M7989 Other specified soft tissue disorders: Secondary | ICD-10-CM | POA: Diagnosis not present

## 2018-12-12 DIAGNOSIS — M25511 Pain in right shoulder: Secondary | ICD-10-CM | POA: Diagnosis not present

## 2018-12-12 DIAGNOSIS — R768 Other specified abnormal immunological findings in serum: Secondary | ICD-10-CM | POA: Diagnosis not present

## 2018-12-26 DIAGNOSIS — Z79899 Other long term (current) drug therapy: Secondary | ICD-10-CM | POA: Diagnosis not present

## 2018-12-26 DIAGNOSIS — Z23 Encounter for immunization: Secondary | ICD-10-CM | POA: Diagnosis not present

## 2018-12-26 DIAGNOSIS — I10 Essential (primary) hypertension: Secondary | ICD-10-CM | POA: Diagnosis not present

## 2018-12-26 DIAGNOSIS — E785 Hyperlipidemia, unspecified: Secondary | ICD-10-CM | POA: Diagnosis not present

## 2018-12-26 DIAGNOSIS — E538 Deficiency of other specified B group vitamins: Secondary | ICD-10-CM | POA: Diagnosis not present

## 2018-12-26 DIAGNOSIS — D649 Anemia, unspecified: Secondary | ICD-10-CM | POA: Diagnosis not present

## 2018-12-26 DIAGNOSIS — E118 Type 2 diabetes mellitus with unspecified complications: Secondary | ICD-10-CM | POA: Diagnosis not present

## 2018-12-26 DIAGNOSIS — M25511 Pain in right shoulder: Secondary | ICD-10-CM | POA: Diagnosis not present

## 2019-01-02 DIAGNOSIS — M25511 Pain in right shoulder: Secondary | ICD-10-CM | POA: Diagnosis not present

## 2019-01-23 DIAGNOSIS — D509 Iron deficiency anemia, unspecified: Secondary | ICD-10-CM | POA: Diagnosis not present

## 2019-01-24 DIAGNOSIS — Z9884 Bariatric surgery status: Secondary | ICD-10-CM | POA: Diagnosis not present

## 2019-01-24 DIAGNOSIS — E611 Iron deficiency: Secondary | ICD-10-CM | POA: Diagnosis not present

## 2019-01-24 DIAGNOSIS — D509 Iron deficiency anemia, unspecified: Secondary | ICD-10-CM | POA: Diagnosis not present

## 2019-01-26 DIAGNOSIS — D509 Iron deficiency anemia, unspecified: Secondary | ICD-10-CM | POA: Diagnosis not present

## 2019-02-02 DIAGNOSIS — D509 Iron deficiency anemia, unspecified: Secondary | ICD-10-CM | POA: Diagnosis not present

## 2019-02-14 DIAGNOSIS — M25511 Pain in right shoulder: Secondary | ICD-10-CM | POA: Diagnosis not present

## 2019-02-19 DIAGNOSIS — M25511 Pain in right shoulder: Secondary | ICD-10-CM | POA: Diagnosis not present

## 2019-02-19 DIAGNOSIS — S46011A Strain of muscle(s) and tendon(s) of the rotator cuff of right shoulder, initial encounter: Secondary | ICD-10-CM | POA: Diagnosis not present

## 2019-02-19 DIAGNOSIS — X58XXXA Exposure to other specified factors, initial encounter: Secondary | ICD-10-CM | POA: Diagnosis not present

## 2019-02-20 DIAGNOSIS — M25511 Pain in right shoulder: Secondary | ICD-10-CM | POA: Diagnosis not present

## 2019-02-23 DIAGNOSIS — Z9181 History of falling: Secondary | ICD-10-CM | POA: Diagnosis not present

## 2019-02-23 DIAGNOSIS — Z6841 Body Mass Index (BMI) 40.0 and over, adult: Secondary | ICD-10-CM | POA: Diagnosis not present

## 2019-02-23 DIAGNOSIS — Z01818 Encounter for other preprocedural examination: Secondary | ICD-10-CM | POA: Diagnosis not present

## 2019-02-23 DIAGNOSIS — N390 Urinary tract infection, site not specified: Secondary | ICD-10-CM | POA: Diagnosis not present

## 2019-03-02 DIAGNOSIS — M792 Neuralgia and neuritis, unspecified: Secondary | ICD-10-CM | POA: Diagnosis not present

## 2019-03-02 DIAGNOSIS — J45909 Unspecified asthma, uncomplicated: Secondary | ICD-10-CM | POA: Diagnosis not present

## 2019-03-02 DIAGNOSIS — S46011A Strain of muscle(s) and tendon(s) of the rotator cuff of right shoulder, initial encounter: Secondary | ICD-10-CM | POA: Diagnosis not present

## 2019-03-02 DIAGNOSIS — E559 Vitamin D deficiency, unspecified: Secondary | ICD-10-CM | POA: Diagnosis not present

## 2019-03-02 DIAGNOSIS — M75101 Unspecified rotator cuff tear or rupture of right shoulder, not specified as traumatic: Secondary | ICD-10-CM | POA: Diagnosis not present

## 2019-03-02 DIAGNOSIS — Z87891 Personal history of nicotine dependence: Secondary | ICD-10-CM | POA: Diagnosis not present

## 2019-03-02 DIAGNOSIS — K219 Gastro-esophageal reflux disease without esophagitis: Secondary | ICD-10-CM | POA: Diagnosis not present

## 2019-03-02 DIAGNOSIS — E118 Type 2 diabetes mellitus with unspecified complications: Secondary | ICD-10-CM | POA: Diagnosis not present

## 2019-03-02 DIAGNOSIS — Z9181 History of falling: Secondary | ICD-10-CM | POA: Diagnosis not present

## 2019-03-02 DIAGNOSIS — G8918 Other acute postprocedural pain: Secondary | ICD-10-CM | POA: Diagnosis not present

## 2019-03-02 DIAGNOSIS — M25511 Pain in right shoulder: Secondary | ICD-10-CM | POA: Diagnosis not present

## 2019-03-02 DIAGNOSIS — Z7982 Long term (current) use of aspirin: Secondary | ICD-10-CM | POA: Diagnosis not present

## 2019-03-02 DIAGNOSIS — M13811 Other specified arthritis, right shoulder: Secondary | ICD-10-CM | POA: Diagnosis not present

## 2019-03-02 DIAGNOSIS — Z79899 Other long term (current) drug therapy: Secondary | ICD-10-CM | POA: Diagnosis not present

## 2019-03-02 DIAGNOSIS — E538 Deficiency of other specified B group vitamins: Secondary | ICD-10-CM | POA: Diagnosis not present

## 2019-03-02 DIAGNOSIS — I10 Essential (primary) hypertension: Secondary | ICD-10-CM | POA: Diagnosis not present

## 2019-03-02 DIAGNOSIS — M7581 Other shoulder lesions, right shoulder: Secondary | ICD-10-CM | POA: Diagnosis not present

## 2019-03-02 DIAGNOSIS — M818 Other osteoporosis without current pathological fracture: Secondary | ICD-10-CM | POA: Diagnosis not present

## 2019-03-02 DIAGNOSIS — M19011 Primary osteoarthritis, right shoulder: Secondary | ICD-10-CM | POA: Diagnosis not present

## 2019-03-02 DIAGNOSIS — S46111A Strain of muscle, fascia and tendon of long head of biceps, right arm, initial encounter: Secondary | ICD-10-CM | POA: Diagnosis not present

## 2019-03-02 DIAGNOSIS — M7551 Bursitis of right shoulder: Secondary | ICD-10-CM | POA: Diagnosis not present

## 2019-03-08 DIAGNOSIS — Z4789 Encounter for other orthopedic aftercare: Secondary | ICD-10-CM | POA: Diagnosis not present

## 2019-03-08 DIAGNOSIS — M25611 Stiffness of right shoulder, not elsewhere classified: Secondary | ICD-10-CM | POA: Diagnosis not present

## 2019-03-08 DIAGNOSIS — M25511 Pain in right shoulder: Secondary | ICD-10-CM | POA: Diagnosis not present

## 2019-03-08 DIAGNOSIS — M6281 Muscle weakness (generalized): Secondary | ICD-10-CM | POA: Diagnosis not present

## 2019-03-12 DIAGNOSIS — M25611 Stiffness of right shoulder, not elsewhere classified: Secondary | ICD-10-CM | POA: Diagnosis not present

## 2019-03-12 DIAGNOSIS — Z4789 Encounter for other orthopedic aftercare: Secondary | ICD-10-CM | POA: Diagnosis not present

## 2019-03-12 DIAGNOSIS — M25511 Pain in right shoulder: Secondary | ICD-10-CM | POA: Diagnosis not present

## 2019-03-12 DIAGNOSIS — M6281 Muscle weakness (generalized): Secondary | ICD-10-CM | POA: Diagnosis not present

## 2019-03-15 DIAGNOSIS — M25511 Pain in right shoulder: Secondary | ICD-10-CM | POA: Diagnosis not present

## 2019-03-15 DIAGNOSIS — Z4789 Encounter for other orthopedic aftercare: Secondary | ICD-10-CM | POA: Diagnosis not present

## 2019-03-15 DIAGNOSIS — M25611 Stiffness of right shoulder, not elsewhere classified: Secondary | ICD-10-CM | POA: Diagnosis not present

## 2019-03-15 DIAGNOSIS — M6281 Muscle weakness (generalized): Secondary | ICD-10-CM | POA: Diagnosis not present

## 2019-03-22 DIAGNOSIS — M6281 Muscle weakness (generalized): Secondary | ICD-10-CM | POA: Diagnosis not present

## 2019-03-22 DIAGNOSIS — M25611 Stiffness of right shoulder, not elsewhere classified: Secondary | ICD-10-CM | POA: Diagnosis not present

## 2019-03-22 DIAGNOSIS — M25511 Pain in right shoulder: Secondary | ICD-10-CM | POA: Diagnosis not present

## 2019-03-22 DIAGNOSIS — Z4789 Encounter for other orthopedic aftercare: Secondary | ICD-10-CM | POA: Diagnosis not present

## 2019-03-28 DIAGNOSIS — M6281 Muscle weakness (generalized): Secondary | ICD-10-CM | POA: Diagnosis not present

## 2019-03-28 DIAGNOSIS — M25511 Pain in right shoulder: Secondary | ICD-10-CM | POA: Diagnosis not present

## 2019-03-28 DIAGNOSIS — M25611 Stiffness of right shoulder, not elsewhere classified: Secondary | ICD-10-CM | POA: Diagnosis not present

## 2019-03-28 DIAGNOSIS — Z4789 Encounter for other orthopedic aftercare: Secondary | ICD-10-CM | POA: Diagnosis not present

## 2019-03-30 DIAGNOSIS — M6281 Muscle weakness (generalized): Secondary | ICD-10-CM | POA: Diagnosis not present

## 2019-03-30 DIAGNOSIS — M25511 Pain in right shoulder: Secondary | ICD-10-CM | POA: Diagnosis not present

## 2019-03-30 DIAGNOSIS — M25611 Stiffness of right shoulder, not elsewhere classified: Secondary | ICD-10-CM | POA: Diagnosis not present

## 2019-03-30 DIAGNOSIS — E538 Deficiency of other specified B group vitamins: Secondary | ICD-10-CM | POA: Diagnosis not present

## 2019-03-30 DIAGNOSIS — Z4789 Encounter for other orthopedic aftercare: Secondary | ICD-10-CM | POA: Diagnosis not present

## 2019-04-05 DIAGNOSIS — Z4789 Encounter for other orthopedic aftercare: Secondary | ICD-10-CM | POA: Diagnosis not present

## 2019-04-05 DIAGNOSIS — M25611 Stiffness of right shoulder, not elsewhere classified: Secondary | ICD-10-CM | POA: Diagnosis not present

## 2019-04-05 DIAGNOSIS — M25511 Pain in right shoulder: Secondary | ICD-10-CM | POA: Diagnosis not present

## 2019-04-05 DIAGNOSIS — M6281 Muscle weakness (generalized): Secondary | ICD-10-CM | POA: Diagnosis not present

## 2019-04-19 DIAGNOSIS — M25511 Pain in right shoulder: Secondary | ICD-10-CM | POA: Diagnosis not present

## 2019-05-17 DIAGNOSIS — M25511 Pain in right shoulder: Secondary | ICD-10-CM | POA: Diagnosis not present

## 2019-05-17 DIAGNOSIS — I10 Essential (primary) hypertension: Secondary | ICD-10-CM | POA: Diagnosis not present

## 2019-05-25 DIAGNOSIS — M25511 Pain in right shoulder: Secondary | ICD-10-CM | POA: Diagnosis not present

## 2019-05-29 DIAGNOSIS — M25511 Pain in right shoulder: Secondary | ICD-10-CM | POA: Diagnosis not present

## 2019-06-06 DIAGNOSIS — E538 Deficiency of other specified B group vitamins: Secondary | ICD-10-CM | POA: Diagnosis not present

## 2019-06-06 DIAGNOSIS — M792 Neuralgia and neuritis, unspecified: Secondary | ICD-10-CM | POA: Diagnosis not present

## 2019-06-06 DIAGNOSIS — I10 Essential (primary) hypertension: Secondary | ICD-10-CM | POA: Diagnosis not present

## 2019-06-06 DIAGNOSIS — E118 Type 2 diabetes mellitus with unspecified complications: Secondary | ICD-10-CM | POA: Diagnosis not present

## 2019-06-20 DIAGNOSIS — E118 Type 2 diabetes mellitus with unspecified complications: Secondary | ICD-10-CM | POA: Diagnosis not present

## 2019-06-20 DIAGNOSIS — D649 Anemia, unspecified: Secondary | ICD-10-CM | POA: Diagnosis not present

## 2019-06-20 DIAGNOSIS — E538 Deficiency of other specified B group vitamins: Secondary | ICD-10-CM | POA: Diagnosis not present

## 2019-06-20 DIAGNOSIS — E785 Hyperlipidemia, unspecified: Secondary | ICD-10-CM | POA: Diagnosis not present

## 2019-07-23 DIAGNOSIS — D509 Iron deficiency anemia, unspecified: Secondary | ICD-10-CM | POA: Diagnosis not present

## 2019-07-26 DIAGNOSIS — E538 Deficiency of other specified B group vitamins: Secondary | ICD-10-CM | POA: Diagnosis not present

## 2019-07-26 DIAGNOSIS — Z23 Encounter for immunization: Secondary | ICD-10-CM | POA: Diagnosis not present

## 2019-07-26 DIAGNOSIS — E118 Type 2 diabetes mellitus with unspecified complications: Secondary | ICD-10-CM | POA: Diagnosis not present

## 2019-07-26 DIAGNOSIS — M545 Low back pain: Secondary | ICD-10-CM | POA: Diagnosis not present

## 2019-07-26 DIAGNOSIS — D649 Anemia, unspecified: Secondary | ICD-10-CM | POA: Diagnosis not present

## 2019-07-27 DIAGNOSIS — D509 Iron deficiency anemia, unspecified: Secondary | ICD-10-CM | POA: Diagnosis not present

## 2019-08-22 DIAGNOSIS — D500 Iron deficiency anemia secondary to blood loss (chronic): Secondary | ICD-10-CM | POA: Diagnosis not present

## 2019-08-22 DIAGNOSIS — K2270 Barrett's esophagus without dysplasia: Secondary | ICD-10-CM | POA: Diagnosis not present

## 2019-08-28 DIAGNOSIS — E538 Deficiency of other specified B group vitamins: Secondary | ICD-10-CM | POA: Diagnosis not present

## 2019-08-31 DIAGNOSIS — Z79899 Other long term (current) drug therapy: Secondary | ICD-10-CM | POA: Diagnosis not present

## 2019-08-31 DIAGNOSIS — Z90710 Acquired absence of both cervix and uterus: Secondary | ICD-10-CM | POA: Diagnosis not present

## 2019-08-31 DIAGNOSIS — R110 Nausea: Secondary | ICD-10-CM | POA: Diagnosis not present

## 2019-08-31 DIAGNOSIS — Z7982 Long term (current) use of aspirin: Secondary | ICD-10-CM | POA: Diagnosis not present

## 2019-08-31 DIAGNOSIS — E119 Type 2 diabetes mellitus without complications: Secondary | ICD-10-CM | POA: Diagnosis not present

## 2019-08-31 DIAGNOSIS — D509 Iron deficiency anemia, unspecified: Secondary | ICD-10-CM | POA: Diagnosis not present

## 2019-08-31 DIAGNOSIS — K219 Gastro-esophageal reflux disease without esophagitis: Secondary | ICD-10-CM | POA: Diagnosis not present

## 2019-08-31 DIAGNOSIS — R1310 Dysphagia, unspecified: Secondary | ICD-10-CM | POA: Diagnosis not present

## 2019-08-31 DIAGNOSIS — Z9049 Acquired absence of other specified parts of digestive tract: Secondary | ICD-10-CM | POA: Diagnosis not present

## 2019-08-31 DIAGNOSIS — Z9884 Bariatric surgery status: Secondary | ICD-10-CM | POA: Diagnosis not present

## 2019-08-31 DIAGNOSIS — K2270 Barrett's esophagus without dysplasia: Secondary | ICD-10-CM | POA: Diagnosis not present

## 2019-08-31 DIAGNOSIS — K229 Disease of esophagus, unspecified: Secondary | ICD-10-CM | POA: Diagnosis not present

## 2019-08-31 DIAGNOSIS — I10 Essential (primary) hypertension: Secondary | ICD-10-CM | POA: Diagnosis not present

## 2019-08-31 DIAGNOSIS — J45909 Unspecified asthma, uncomplicated: Secondary | ICD-10-CM | POA: Diagnosis not present

## 2019-11-30 DIAGNOSIS — E785 Hyperlipidemia, unspecified: Secondary | ICD-10-CM | POA: Diagnosis not present

## 2019-11-30 DIAGNOSIS — I10 Essential (primary) hypertension: Secondary | ICD-10-CM | POA: Diagnosis not present

## 2019-11-30 DIAGNOSIS — E538 Deficiency of other specified B group vitamins: Secondary | ICD-10-CM | POA: Diagnosis not present

## 2019-11-30 DIAGNOSIS — Z79899 Other long term (current) drug therapy: Secondary | ICD-10-CM | POA: Diagnosis not present

## 2019-11-30 DIAGNOSIS — E118 Type 2 diabetes mellitus with unspecified complications: Secondary | ICD-10-CM | POA: Diagnosis not present

## 2019-11-30 DIAGNOSIS — D649 Anemia, unspecified: Secondary | ICD-10-CM | POA: Diagnosis not present

## 2019-11-30 DIAGNOSIS — L299 Pruritus, unspecified: Secondary | ICD-10-CM | POA: Diagnosis not present

## 2020-01-08 DIAGNOSIS — E538 Deficiency of other specified B group vitamins: Secondary | ICD-10-CM | POA: Diagnosis not present

## 2020-03-04 DIAGNOSIS — E118 Type 2 diabetes mellitus with unspecified complications: Secondary | ICD-10-CM | POA: Diagnosis not present

## 2020-03-04 DIAGNOSIS — E538 Deficiency of other specified B group vitamins: Secondary | ICD-10-CM | POA: Diagnosis not present

## 2020-03-04 DIAGNOSIS — D649 Anemia, unspecified: Secondary | ICD-10-CM | POA: Diagnosis not present

## 2020-03-04 DIAGNOSIS — Z139 Encounter for screening, unspecified: Secondary | ICD-10-CM | POA: Diagnosis not present

## 2020-03-04 DIAGNOSIS — Z1331 Encounter for screening for depression: Secondary | ICD-10-CM | POA: Diagnosis not present

## 2020-03-04 DIAGNOSIS — L299 Pruritus, unspecified: Secondary | ICD-10-CM | POA: Diagnosis not present

## 2020-03-04 DIAGNOSIS — E785 Hyperlipidemia, unspecified: Secondary | ICD-10-CM | POA: Diagnosis not present

## 2020-03-04 DIAGNOSIS — Z9181 History of falling: Secondary | ICD-10-CM | POA: Diagnosis not present

## 2020-03-10 DIAGNOSIS — D509 Iron deficiency anemia, unspecified: Secondary | ICD-10-CM | POA: Diagnosis not present

## 2020-03-14 DIAGNOSIS — D509 Iron deficiency anemia, unspecified: Secondary | ICD-10-CM | POA: Diagnosis not present

## 2020-03-21 DIAGNOSIS — D509 Iron deficiency anemia, unspecified: Secondary | ICD-10-CM | POA: Diagnosis not present

## 2020-04-16 DIAGNOSIS — E538 Deficiency of other specified B group vitamins: Secondary | ICD-10-CM | POA: Diagnosis not present

## 2020-05-21 DIAGNOSIS — E538 Deficiency of other specified B group vitamins: Secondary | ICD-10-CM | POA: Diagnosis not present

## 2020-07-01 DIAGNOSIS — E118 Type 2 diabetes mellitus with unspecified complications: Secondary | ICD-10-CM | POA: Diagnosis not present

## 2020-07-01 DIAGNOSIS — I10 Essential (primary) hypertension: Secondary | ICD-10-CM | POA: Diagnosis not present

## 2020-07-01 DIAGNOSIS — R110 Nausea: Secondary | ICD-10-CM | POA: Diagnosis not present

## 2020-07-01 DIAGNOSIS — D649 Anemia, unspecified: Secondary | ICD-10-CM | POA: Diagnosis not present

## 2020-07-01 DIAGNOSIS — E785 Hyperlipidemia, unspecified: Secondary | ICD-10-CM | POA: Diagnosis not present

## 2020-07-01 DIAGNOSIS — Z1231 Encounter for screening mammogram for malignant neoplasm of breast: Secondary | ICD-10-CM | POA: Diagnosis not present

## 2020-07-01 DIAGNOSIS — E538 Deficiency of other specified B group vitamins: Secondary | ICD-10-CM | POA: Diagnosis not present

## 2020-07-01 DIAGNOSIS — M25532 Pain in left wrist: Secondary | ICD-10-CM | POA: Diagnosis not present

## 2020-07-01 DIAGNOSIS — F329 Major depressive disorder, single episode, unspecified: Secondary | ICD-10-CM | POA: Diagnosis not present

## 2020-07-01 DIAGNOSIS — M545 Low back pain: Secondary | ICD-10-CM | POA: Diagnosis not present

## 2020-07-01 DIAGNOSIS — Z6841 Body Mass Index (BMI) 40.0 and over, adult: Secondary | ICD-10-CM | POA: Diagnosis not present

## 2020-07-01 DIAGNOSIS — K2100 Gastro-esophageal reflux disease with esophagitis, without bleeding: Secondary | ICD-10-CM | POA: Diagnosis not present

## 2020-07-03 DIAGNOSIS — S6992XA Unspecified injury of left wrist, hand and finger(s), initial encounter: Secondary | ICD-10-CM | POA: Diagnosis not present

## 2020-07-03 DIAGNOSIS — Z1231 Encounter for screening mammogram for malignant neoplasm of breast: Secondary | ICD-10-CM | POA: Diagnosis not present

## 2020-07-09 DIAGNOSIS — R928 Other abnormal and inconclusive findings on diagnostic imaging of breast: Secondary | ICD-10-CM | POA: Diagnosis not present

## 2020-07-09 DIAGNOSIS — N6323 Unspecified lump in the left breast, lower outer quadrant: Secondary | ICD-10-CM | POA: Diagnosis not present

## 2020-07-21 DIAGNOSIS — Z23 Encounter for immunization: Secondary | ICD-10-CM | POA: Diagnosis not present

## 2020-07-21 DIAGNOSIS — N760 Acute vaginitis: Secondary | ICD-10-CM | POA: Diagnosis not present

## 2020-07-21 DIAGNOSIS — N898 Other specified noninflammatory disorders of vagina: Secondary | ICD-10-CM | POA: Diagnosis not present

## 2020-07-21 DIAGNOSIS — I10 Essential (primary) hypertension: Secondary | ICD-10-CM | POA: Diagnosis not present

## 2020-07-21 DIAGNOSIS — R300 Dysuria: Secondary | ICD-10-CM | POA: Diagnosis not present

## 2020-07-21 DIAGNOSIS — R8290 Unspecified abnormal findings in urine: Secondary | ICD-10-CM | POA: Diagnosis not present

## 2020-07-21 DIAGNOSIS — B9689 Other specified bacterial agents as the cause of diseases classified elsewhere: Secondary | ICD-10-CM | POA: Diagnosis not present

## 2020-07-21 DIAGNOSIS — M25532 Pain in left wrist: Secondary | ICD-10-CM | POA: Diagnosis not present

## 2020-07-21 DIAGNOSIS — Z6841 Body Mass Index (BMI) 40.0 and over, adult: Secondary | ICD-10-CM | POA: Diagnosis not present

## 2020-08-06 DIAGNOSIS — M659 Synovitis and tenosynovitis, unspecified: Secondary | ICD-10-CM | POA: Diagnosis not present

## 2020-08-26 ENCOUNTER — Other Ambulatory Visit: Payer: Self-pay | Admitting: Hematology and Oncology

## 2020-08-26 DIAGNOSIS — D508 Other iron deficiency anemias: Secondary | ICD-10-CM

## 2020-08-29 DIAGNOSIS — E538 Deficiency of other specified B group vitamins: Secondary | ICD-10-CM | POA: Diagnosis not present

## 2020-09-02 ENCOUNTER — Inpatient Hospital Stay: Payer: Medicare Other | Admitting: Hematology and Oncology

## 2020-09-02 ENCOUNTER — Inpatient Hospital Stay: Payer: Medicare Other

## 2020-09-03 ENCOUNTER — Ambulatory Visit: Payer: Medicare Other | Admitting: Hematology and Oncology

## 2020-09-03 ENCOUNTER — Other Ambulatory Visit: Payer: Medicare Other

## 2020-09-22 NOTE — Progress Notes (Deleted)
[facility]  [address] [place],  [place]  [address] [phone]  Clinic Day:  09/22/2020  Referring physician: No ref. provider found   CHIEF COMPLAINT:  CC: ***  Current Treatment:  ***   HISTORY OF PRESENT ILLNESS:  Tammy Dickson is a 74 y.o. female with a history of ***    INTERVAL HISTORY:  Tammy Dickson is seen in the *** clinic for follow up of her ***. She denies fever, chills, night sweats, or other signs of infection. She denies cardiorespiratory and gastrointestinal issues. She  denies pain. Her appetite is good. Her weight {Weight change:10426}.  REVIEW OF SYSTEMS:  Review of Systems - Oncology   VITALS:  There were no vitals taken for this visit.  Wt Readings from Last 3 Encounters:  03/21/20 263 lb 4.8 oz (119.4 kg)    There is no height or weight on file to calculate BMI.  Performance status (ECOG): {CHL ONC Q3448304  PHYSICAL EXAM:  Physical Exam Lymph nodes:   There is no cervical, clavicular, axillary or inguinal lymphadenopathy.   LABS:  No flowsheet data found. No flowsheet data found.   No results found for: CEA1 / No results found for: CEA1 No results found for: PSA1 No results found for: WOE321 No results found for: CAN125  No results found for: TOTALPROTELP, ALBUMINELP, A1GS, A2GS, BETS, BETA2SER, GAMS, MSPIKE, SPEI No results found for: TIBC, FERRITIN, IRONPCTSAT No results found for: LDH  STUDIES:  No results found.    HISTORY:  No past medical history on file.  *** The histories are not reviewed yet. Please review them in the "History" navigator section and refresh this [place].  No family history on file.  Social History:  has no history on file for tobacco use, alcohol use, and drug use.The patient is {Blank single:19197::"alone","accompanied by"} *** today.  Allergies:  Allergies  Allergen Reactions  . Ca-Fe [Iron] Other (See Comments)    vomiting  . Sulfa Antibiotics  Other (See Comments)    Unknown     Current Medications: Current Outpatient Medications  Medication Sig Dispense Refill  . aspirin EC 81 MG tablet Take 81 mg by mouth daily. Swallow whole.    . Budesonide-Formoterol Fumarate (SYMBICORT IN) Inhale 1 puff into the lungs as needed.    . diphenhydrAMINE (BENADRYL) 25 MG tablet Take 25 mg by mouth every 4 (four) hours as needed.    . gabapentin (NEURONTIN) 800 MG tablet Take 800 mg by mouth 2 (two) times daily.    Marland Kitchen losartan (COZAAR) 50 MG tablet Take 50 mg by mouth daily.    Marland Kitchen omeprazole (PRILOSEC) 40 MG capsule Take 40 mg by mouth daily.    . ondansetron (ZOFRAN) 8 MG tablet Take 8 mg by mouth every 4 (four) hours as needed for nausea or vomiting.    . venlafaxine (EFFEXOR) 75 MG tablet Take 75 mg by mouth 2 (two) times daily.     No current facility-administered medications for this visit.     ASSESSMENT & PLAN:   Assessment:  Tammy Dickson is a 74 y.o. female ***  Plan: ***  The patient understands the plans discussed today and is in agreement with them.    The patient knows to contact our office if her develops concerns prior to her next appointment.   I provided *** minutes (9:18 AM - 9:18 AM) of face-to-face time during this this encounter and > 50% was spent counseling as documented under my assessment  and plan.    Melodye Ped, NP

## 2020-09-23 ENCOUNTER — Inpatient Hospital Stay: Payer: Medicare Other

## 2020-09-23 ENCOUNTER — Inpatient Hospital Stay: Payer: Medicare Other | Attending: Hematology and Oncology | Admitting: Hematology and Oncology

## 2020-09-30 DIAGNOSIS — J45909 Unspecified asthma, uncomplicated: Secondary | ICD-10-CM | POA: Diagnosis not present

## 2020-09-30 DIAGNOSIS — E118 Type 2 diabetes mellitus with unspecified complications: Secondary | ICD-10-CM | POA: Diagnosis not present

## 2020-09-30 DIAGNOSIS — E1140 Type 2 diabetes mellitus with diabetic neuropathy, unspecified: Secondary | ICD-10-CM | POA: Diagnosis not present

## 2020-09-30 DIAGNOSIS — G43909 Migraine, unspecified, not intractable, without status migrainosus: Secondary | ICD-10-CM | POA: Diagnosis not present

## 2020-09-30 DIAGNOSIS — Z6841 Body Mass Index (BMI) 40.0 and over, adult: Secondary | ICD-10-CM | POA: Diagnosis not present

## 2020-09-30 DIAGNOSIS — I10 Essential (primary) hypertension: Secondary | ICD-10-CM | POA: Diagnosis not present

## 2020-09-30 DIAGNOSIS — D649 Anemia, unspecified: Secondary | ICD-10-CM | POA: Diagnosis not present

## 2020-09-30 DIAGNOSIS — F32A Depression, unspecified: Secondary | ICD-10-CM | POA: Diagnosis not present

## 2020-09-30 DIAGNOSIS — E538 Deficiency of other specified B group vitamins: Secondary | ICD-10-CM | POA: Diagnosis not present

## 2020-09-30 DIAGNOSIS — E785 Hyperlipidemia, unspecified: Secondary | ICD-10-CM | POA: Diagnosis not present

## 2020-09-30 DIAGNOSIS — M79674 Pain in right toe(s): Secondary | ICD-10-CM | POA: Diagnosis not present

## 2020-09-30 DIAGNOSIS — L299 Pruritus, unspecified: Secondary | ICD-10-CM | POA: Diagnosis not present

## 2020-10-03 DIAGNOSIS — M47816 Spondylosis without myelopathy or radiculopathy, lumbar region: Secondary | ICD-10-CM | POA: Diagnosis not present

## 2020-10-03 DIAGNOSIS — M1611 Unilateral primary osteoarthritis, right hip: Secondary | ICD-10-CM | POA: Diagnosis not present

## 2020-10-03 DIAGNOSIS — S8001XA Contusion of right knee, initial encounter: Secondary | ICD-10-CM | POA: Diagnosis not present

## 2020-10-03 DIAGNOSIS — M25561 Pain in right knee: Secondary | ICD-10-CM | POA: Diagnosis not present

## 2020-10-03 DIAGNOSIS — M7989 Other specified soft tissue disorders: Secondary | ICD-10-CM | POA: Diagnosis not present

## 2020-10-08 DIAGNOSIS — S8001XA Contusion of right knee, initial encounter: Secondary | ICD-10-CM | POA: Diagnosis not present

## 2020-10-08 DIAGNOSIS — Z96651 Presence of right artificial knee joint: Secondary | ICD-10-CM | POA: Diagnosis not present

## 2020-10-14 DIAGNOSIS — I10 Essential (primary) hypertension: Secondary | ICD-10-CM | POA: Diagnosis not present

## 2020-10-14 DIAGNOSIS — E118 Type 2 diabetes mellitus with unspecified complications: Secondary | ICD-10-CM | POA: Diagnosis not present

## 2020-10-14 DIAGNOSIS — D649 Anemia, unspecified: Secondary | ICD-10-CM | POA: Diagnosis not present

## 2020-10-14 DIAGNOSIS — S8001XA Contusion of right knee, initial encounter: Secondary | ICD-10-CM | POA: Diagnosis not present

## 2020-10-28 DIAGNOSIS — Z23 Encounter for immunization: Secondary | ICD-10-CM | POA: Diagnosis not present

## 2020-11-05 DIAGNOSIS — B070 Plantar wart: Secondary | ICD-10-CM | POA: Diagnosis not present

## 2020-11-05 DIAGNOSIS — M25561 Pain in right knee: Secondary | ICD-10-CM | POA: Diagnosis not present

## 2020-11-05 DIAGNOSIS — Z6841 Body Mass Index (BMI) 40.0 and over, adult: Secondary | ICD-10-CM | POA: Diagnosis not present

## 2020-11-05 DIAGNOSIS — I10 Essential (primary) hypertension: Secondary | ICD-10-CM | POA: Diagnosis not present

## 2020-11-05 DIAGNOSIS — E1140 Type 2 diabetes mellitus with diabetic neuropathy, unspecified: Secondary | ICD-10-CM | POA: Diagnosis not present

## 2020-11-05 DIAGNOSIS — E538 Deficiency of other specified B group vitamins: Secondary | ICD-10-CM | POA: Diagnosis not present

## 2020-11-05 DIAGNOSIS — D649 Anemia, unspecified: Secondary | ICD-10-CM | POA: Diagnosis not present

## 2020-11-06 DIAGNOSIS — S8001XA Contusion of right knee, initial encounter: Secondary | ICD-10-CM | POA: Diagnosis not present

## 2020-11-06 DIAGNOSIS — Z96651 Presence of right artificial knee joint: Secondary | ICD-10-CM | POA: Diagnosis not present

## 2020-11-07 ENCOUNTER — Telehealth: Payer: Self-pay | Admitting: Hematology and Oncology

## 2020-11-07 NOTE — Telephone Encounter (Signed)
Patient called to rescheduled Nov Missed Appt's - She rescheduled to 1/11 Labs, Follow Up at 10:00 am

## 2020-11-11 ENCOUNTER — Encounter: Payer: Self-pay | Admitting: Hematology and Oncology

## 2020-11-11 ENCOUNTER — Inpatient Hospital Stay: Payer: Medicare Other | Attending: Hematology and Oncology

## 2020-11-11 ENCOUNTER — Inpatient Hospital Stay (INDEPENDENT_AMBULATORY_CARE_PROVIDER_SITE_OTHER): Payer: Medicare Other | Admitting: Hematology and Oncology

## 2020-11-11 ENCOUNTER — Other Ambulatory Visit: Payer: Self-pay

## 2020-11-11 DIAGNOSIS — Z0001 Encounter for general adult medical examination with abnormal findings: Secondary | ICD-10-CM | POA: Diagnosis not present

## 2020-11-11 DIAGNOSIS — D649 Anemia, unspecified: Secondary | ICD-10-CM | POA: Diagnosis not present

## 2020-11-11 DIAGNOSIS — Z79899 Other long term (current) drug therapy: Secondary | ICD-10-CM | POA: Insufficient documentation

## 2020-11-11 DIAGNOSIS — D509 Iron deficiency anemia, unspecified: Secondary | ICD-10-CM

## 2020-11-11 LAB — BASIC METABOLIC PANEL
BUN: 10 (ref 4–21)
CO2: 25 — AB (ref 13–22)
Chloride: 106 (ref 99–108)
Creatinine: 0.7 (ref 0.5–1.1)
Glucose: 181
Potassium: 3.9 (ref 3.4–5.3)
Sodium: 139 (ref 137–147)

## 2020-11-11 LAB — HEPATIC FUNCTION PANEL
ALT: 14 (ref 7–35)
AST: 23 (ref 13–35)
Alkaline Phosphatase: 124 (ref 25–125)
Bilirubin, Total: 0.4

## 2020-11-11 LAB — CBC: RBC: 4.09 (ref 3.87–5.11)

## 2020-11-11 LAB — COMPREHENSIVE METABOLIC PANEL
Albumin: 3.9 (ref 3.5–5.0)
Calcium: 9.4 (ref 8.7–10.7)

## 2020-11-11 LAB — CBC AND DIFFERENTIAL
HCT: 36 (ref 36–46)
Hemoglobin: 11.5 — AB (ref 12.0–16.0)
Neutrophils Absolute: 4.23
Platelets: 338 (ref 150–399)
WBC: 7.3

## 2020-11-11 LAB — IRON,TIBC AND FERRITIN PANEL
%SAT: 11.1
Ferritin: 12.6
Iron: 34
TIBC: 304

## 2020-11-11 MED ORDER — HYDROXYZINE HCL 10 MG PO TABS: 10.0000 mg | ORAL_TABLET | Freq: Three times a day (TID) | ORAL | 0 refills | Status: DC | PRN

## 2020-11-11 NOTE — Progress Notes (Signed)
[facility]  [address] [place],  [place]  [address] [phone]  Clinic Day:  11/11/2020  Referring physician: No ref. provider found   CHIEF COMPLAINT:  CC: Iron deficiency  Current Treatment:  Observation   HISTORY OF PRESENT ILLNESS:  Tammy Dickson is a 75 y.o. female with a history of  iron deficiency anemia.  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  She does not tolerate oral iron supplementation.  She also has B12 deficiency, for which she is on B12 injections monthly.  In May 2014, colonoscopy and EGD did not reveal any source of blood loss.  She received IV iron in 2014, but did not follow up with our office.  She will was referred back in November 2017 with recurrent iron deficiency.  She received IV iron in the form of Feraheme in November 2017.  She had a good response to the IV iron with normalization of her hemoglobin.  She had an EGD in February, which revealed persistent Barrett 's esophagus.  She had persistent iron deficiency at her visit in September, but as she was not anemic, further IV iron was not given.  She is here for routine follow-up and states she has worsening fatigue.  Since her last visit, she had a repeat colonoscopy, which did not reveal any obvious source of blood loss.  She did have removal of 2 tubular adenomas.  Repeat colonoscopy in 3 years was recommended.  She denies any respiratory or gastrointestinal symptoms.  She denies any overt form of blood loss.  She states her appetite is good and she is eating well.  When she was seen in December of 2018, her hemoglobin had drifted back down to 11.3 and the studies were consistent with iron deficiency, with a ferritin of 5 and a saturation of 9%.  She therefore was given intravenous Feraheme again.  She continues on B12 injections.  She says she is having less fatigue now.  She does have known Barrett's esophagus and  her last esophageal dilation was 2 years ago and she feels she needs it done again, as she is having occasional choking episodes..       INTERVAL HISTORY:  Tammy Dickson is here today for routine follow up of her iron deficiency. She denies fevers or chills. She  denies pain. Her appetite is good. Her weight has decreased 10 pounds over last 8 months. She experienced a hard fall and injured her knee. This happened about 5 weeks ago and was attributed to her gabapentin 800 mg that she was taking twice daily. She was instructed to only take that at night. She states this still makes her feel unsteady. 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 She has chronic itching of her skin which has worsened over the last few days.  She states she feels like her iron is low again as she has noted some light headedness and low energy. CBC and CMP today are unremarkable. Iron studies reveal iron 34.0, saturation 11.1, and ferritin 12.6.   REVIEW OF SYSTEMS:  Review of Systems  Constitutional: Positive for appetite change. Negative for chills, diaphoresis, fatigue, fever and unexpected weight change.  HENT:   Negative for hearing loss, lump/mass, mouth sores, nosebleeds, sore throat, tinnitus, trouble swallowing and voice change.   Eyes: Negative for eye problems and icterus.  Respiratory: Negative for chest tightness, cough, hemoptysis, shortness of breath and  wheezing.   Cardiovascular: Negative for chest pain, leg swelling and palpitations.  Gastrointestinal: Negative for abdominal distention, abdominal pain, blood in stool, constipation, diarrhea, nausea, rectal pain and vomiting.  Endocrine: Negative for hot flashes.  Genitourinary: Negative for bladder incontinence, difficulty urinating, dyspareunia, dysuria, frequency, hematuria and nocturia.   Musculoskeletal: Negative for arthralgias, back pain, flank pain, gait problem, myalgias, neck pain and neck stiffness.  Skin: Positive for  itching. Negative for rash and wound.  Neurological: Negative for dizziness, extremity weakness, gait problem, headaches, light-headedness, numbness, seizures and speech difficulty.  Hematological: Negative for adenopathy. Does not bruise/bleed easily.  Psychiatric/Behavioral: Negative for confusion, decreased concentration, depression, sleep disturbance and suicidal ideas. The patient is not nervous/anxious.      VITALS:  Blood pressure (!) 179/89, pulse 83, temperature 98 F (36.7 C), temperature source Oral, resp. rate 18, height 5\' 3"  (1.6 m), weight 253 lb 11.2 oz (115.1 kg), SpO2 94 %.  Wt Readings from Last 3 Encounters:  11/11/20 253 lb 11.2 oz (115.1 kg)  03/21/20 263 lb 4.8 oz (119.4 kg)    Body mass index is 44.94 kg/m.  Performance status (ECOG): 1 - Symptomatic but completely ambulatory  PHYSICAL EXAM:  Physical Exam Constitutional:      General: She is not in acute distress.    Appearance: Normal appearance. She is normal weight. She is not ill-appearing, toxic-appearing or diaphoretic.  HENT:     Head: Normocephalic and atraumatic.     Right Ear: Tympanic membrane normal.     Left Ear: Tympanic membrane normal.     Nose: Nose normal. No congestion or rhinorrhea.     Mouth/Throat:     Mouth: Mucous membranes are moist.     Pharynx: Oropharynx is clear. No oropharyngeal exudate or posterior oropharyngeal erythema.  Eyes:     General: No scleral icterus.       Right eye: No discharge.        Left eye: No discharge.     Extraocular Movements: Extraocular movements intact.     Conjunctiva/sclera: Conjunctivae normal.     Pupils: Pupils are equal, round, and reactive to light.  Neck:     Vascular: No carotid bruit.  Cardiovascular:     Rate and Rhythm: Normal rate and regular rhythm.     Heart sounds: No murmur heard. No friction rub. No gallop.   Pulmonary:     Effort: Pulmonary effort is normal. No respiratory distress.     Breath sounds: Normal breath sounds.  No stridor. No wheezing, rhonchi or rales.  Chest:     Chest wall: No tenderness.  Abdominal:     General: Abdomen is flat. Bowel sounds are normal. There is no distension.     Palpations: There is no mass.     Tenderness: There is no abdominal tenderness. There is no right CVA tenderness, left CVA tenderness, guarding or rebound.     Hernia: No hernia is present.  Musculoskeletal:        General: No swelling, tenderness, deformity or signs of injury. Normal range of motion.     Cervical back: Normal range of motion and neck supple. No rigidity or tenderness.     Right lower leg: No edema.     Left lower leg: No edema.  Lymphadenopathy:     Cervical: No cervical adenopathy.  Skin:    General: Skin is warm and dry.     Capillary Refill: Capillary refill takes less than 2 seconds.  Coloration: Skin is not jaundiced or pale.     Findings: No bruising, erythema, lesion or rash.  Neurological:     General: No focal deficit present.     Mental Status: She is alert and oriented to person, place, and time. Mental status is at baseline.     Cranial Nerves: No cranial nerve deficit.     Sensory: No sensory deficit.     Motor: No weakness.     Coordination: Coordination normal.     Gait: Gait normal.     Deep Tendon Reflexes: Reflexes normal.  Psychiatric:        Mood and Affect: Mood normal.        Behavior: Behavior normal.        Thought Content: Thought content normal.        Judgment: Judgment normal.    Lymph nodes:   There is no cervical, clavicular, axillary or inguinal lymphadenopathy.   LABS:   CBC Latest Ref Rng & Units 11/11/2020  WBC - 7.3  Hemoglobin 12.0 - 16.0 11.5(A)  Hematocrit 36 - 46 36  Platelets 150 - 399 338   CMP Latest Ref Rng & Units 11/11/2020  BUN 4 - 21 10  Creatinine 0.5 - 1.1 0.7  Sodium 137 - 147 139  Potassium 3.4 - 5.3 3.9  Chloride 99 - 108 106  CO2 13 - 22 25(A)  Calcium 8.7 - 10.7 9.4  Alkaline Phos 25 - 125 124  AST 13 - 35 23  ALT  7 - 35 14     No results found for: CEA1 / No results found for: CEA1 No results found for: PSA1 No results found for: AOZ308 No results found for: MVH846  No results found for: TOTALPROTELP, ALBUMINELP, A1GS, A2GS, BETS, BETA2SER, GAMS, MSPIKE, SPEI Lab Results  Component Value Date   TIBC 304 11/11/2020   FERRITIN 12.6 11/11/2020   IRONPCTSAT 11.1 11/11/2020   No results found for: LDH  STUDIES:  No results found.    HISTORY:  No past medical history on file.    No family history on file.  Social History:  reports that she quit smoking about 17 years ago. Her smoking use included cigarettes. She started smoking about 42 years ago. She has a 12.50 pack-year smoking history. She has never used smokeless tobacco. No history on file for alcohol use and drug use.The patient is alone  today.  Allergies:    Current Medications: Current Outpatient Medications  Medication Sig Dispense Refill  . hydrOXYzine (ATARAX/VISTARIL) 10 MG tablet Take 1 tablet (10 mg total) by mouth 3 (three) times daily as needed. 30 tablet 0  . aspirin EC 81 MG tablet Take 81 mg by mouth daily. Swallow whole.    . Budesonide-Formoterol Fumarate (SYMBICORT IN) Inhale 1 puff into the lungs as needed.    . diphenhydrAMINE (BENADRYL) 25 MG tablet Take 25 mg by mouth every 4 (four) hours as needed.    . gabapentin (NEURONTIN) 800 MG tablet Take 800 mg by mouth 2 (two) times daily.    Marland Kitchen losartan (COZAAR) 50 MG tablet Take 50 mg by mouth daily.    Marland Kitchen omeprazole (PRILOSEC) 40 MG capsule Take 40 mg by mouth daily.    . ondansetron (ZOFRAN) 8 MG tablet Take 8 mg by mouth every 4 (four) hours as needed for nausea or vomiting.    . venlafaxine (EFFEXOR) 75 MG tablet Take 75 mg by mouth 2 (two) times daily.     No  current facility-administered medications for this visit.     ASSESSMENT & PLAN:   Assessment/Plan:  Tammy Dickson is a 75 y.o. female with recurrent iron deficiency. Her iron saturation is low  today at 11.1, so we will schedule for IV iron. I will send in low dose Vistaril for itching. I suggested splitting her gabapentin into 3 doses of 300 throughout the day to see if that will ease her unsteadiness while still controlling her neuropathy. She will return to clinic in 6 months for repeat CBC, CMP, and iron studies.    The patient understands the plans discussed today and is in agreement with them.    The patient knows to contact our office if she develops concerns prior to her next appointment.   I provided 30 minutes of face-to-face time during this this encounter and > 50% was spent counseling as documented under my assessment and plan.    Melodye Ped, NP

## 2020-11-21 ENCOUNTER — Telehealth: Payer: Self-pay | Admitting: Hematology and Oncology

## 2020-11-21 NOTE — Telephone Encounter (Signed)
11/21/20 Spoke with patient and sched IV IRON.

## 2020-11-24 ENCOUNTER — Other Ambulatory Visit: Payer: Self-pay | Admitting: Pharmacist

## 2020-11-28 ENCOUNTER — Other Ambulatory Visit: Payer: Self-pay

## 2020-11-28 ENCOUNTER — Inpatient Hospital Stay: Payer: Medicare Other

## 2020-11-28 VITALS — BP 132/64 | HR 88 | Temp 98.3°F | Resp 18 | Ht 63.0 in | Wt 253.8 lb

## 2020-11-28 DIAGNOSIS — D509 Iron deficiency anemia, unspecified: Secondary | ICD-10-CM

## 2020-11-28 DIAGNOSIS — Z79899 Other long term (current) drug therapy: Secondary | ICD-10-CM | POA: Diagnosis not present

## 2020-11-28 MED ORDER — SODIUM CHLORIDE 0.9 % IV SOLN
510.0000 mg | Freq: Once | INTRAVENOUS | Status: AC
  Administered 2020-11-28: 510 mg via INTRAVENOUS
  Filled 2020-11-28: qty 17

## 2020-11-28 MED ORDER — SODIUM CHLORIDE 0.9 % IV SOLN
Freq: Once | INTRAVENOUS | Status: AC
  Filled 2020-11-28: qty 250

## 2020-11-28 NOTE — Patient Instructions (Signed)
Ferumoxytol injection What is this medicine? FERUMOXYTOL is an iron complex. Iron is used to make healthy red blood cells, which carry oxygen and nutrients throughout the body. This medicine is used to treat iron deficiency anemia. This medicine may be used for other purposes; ask your health care provider or pharmacist if you have questions. COMMON BRAND NAME(S): Feraheme What should I tell my health care provider before I take this medicine? They need to know if you have any of these conditions:  anemia not caused by low iron levels  high levels of iron in the blood  magnetic resonance imaging (MRI) test scheduled  an unusual or allergic reaction to iron, other medicines, foods, dyes, or preservatives  pregnant or trying to get pregnant  breast-feeding How should I use this medicine? This medicine is for injection into a vein. It is given by a health care professional in a hospital or clinic setting. Talk to your pediatrician regarding the use of this medicine in children. Special care may be needed. Overdosage: If you think you have taken too much of this medicine contact a poison control center or emergency room at once. NOTE: This medicine is only for you. Do not share this medicine with others. What if I miss a dose? It is important not to miss your dose. Call your doctor or health care professional if you are unable to keep an appointment. What may interact with this medicine? This medicine may interact with the following medications:  other iron products This list may not describe all possible interactions. Give your health care provider a list of all the medicines, herbs, non-prescription drugs, or dietary supplements you use. Also tell them if you smoke, drink alcohol, or use illegal drugs. Some items may interact with your medicine. What should I watch for while using this medicine? Visit your doctor or healthcare professional regularly. Tell your doctor or healthcare  professional if your symptoms do not start to get better or if they get worse. You may need blood work done while you are taking this medicine. You may need to follow a special diet. Talk to your doctor. Foods that contain iron include: whole grains/cereals, dried fruits, beans, or peas, leafy green vegetables, and organ meats (liver, kidney). What side effects may I notice from receiving this medicine? Side effects that you should report to your doctor or health care professional as soon as possible:  allergic reactions like skin rash, itching or hives, swelling of the face, lips, or tongue  breathing problems  changes in blood pressure  feeling faint or lightheaded, falls  fever or chills  flushing, sweating, or hot feelings  swelling of the ankles or feet Side effects that usually do not require medical attention (report to your doctor or health care professional if they continue or are bothersome):  diarrhea  headache  nausea, vomiting  stomach pain This list may not describe all possible side effects. Call your doctor for medical advice about side effects. You may report side effects to FDA at 1-800-FDA-1088. Where should I keep my medicine? This drug is given in a hospital or clinic and will not be stored at home. NOTE: This sheet is a summary. It may not cover all possible information. If you have questions about this medicine, talk to your doctor, pharmacist, or health care provider.  2021 Elsevier/Gold Standard (2016-12-06 20:21:10)  

## 2020-11-28 NOTE — Progress Notes (Signed)
1434:PT STABLE AT TIME OF DISCHARGE °

## 2020-12-04 ENCOUNTER — Other Ambulatory Visit: Payer: Self-pay

## 2020-12-04 ENCOUNTER — Inpatient Hospital Stay: Payer: Medicare Other | Attending: Hematology and Oncology

## 2020-12-04 VITALS — BP 146/90 | HR 84 | Temp 98.4°F | Resp 18 | Ht 63.0 in | Wt 250.5 lb

## 2020-12-04 DIAGNOSIS — Z79899 Other long term (current) drug therapy: Secondary | ICD-10-CM | POA: Diagnosis not present

## 2020-12-04 DIAGNOSIS — D509 Iron deficiency anemia, unspecified: Secondary | ICD-10-CM | POA: Diagnosis not present

## 2020-12-04 MED ORDER — SODIUM CHLORIDE 0.9 % IV SOLN
510.0000 mg | Freq: Once | INTRAVENOUS | Status: AC
  Administered 2020-12-04: 510 mg via INTRAVENOUS
  Filled 2020-12-04: qty 17

## 2020-12-04 MED ORDER — SODIUM CHLORIDE 0.9 % IV SOLN
Freq: Once | INTRAVENOUS | Status: AC
  Filled 2020-12-04: qty 250

## 2020-12-04 NOTE — Patient Instructions (Signed)
Ferumoxytol injection What is this medicine? FERUMOXYTOL is an iron complex. Iron is used to make healthy red blood cells, which carry oxygen and nutrients throughout the body. This medicine is used to treat iron deficiency anemia. This medicine may be used for other purposes; ask your health care provider or pharmacist if you have questions. COMMON BRAND NAME(S): Feraheme What should I tell my health care provider before I take this medicine? They need to know if you have any of these conditions:  anemia not caused by low iron levels  high levels of iron in the blood  magnetic resonance imaging (MRI) test scheduled  an unusual or allergic reaction to iron, other medicines, foods, dyes, or preservatives  pregnant or trying to get pregnant  breast-feeding How should I use this medicine? This medicine is for injection into a vein. It is given by a health care professional in a hospital or clinic setting. Talk to your pediatrician regarding the use of this medicine in children. Special care may be needed. Overdosage: If you think you have taken too much of this medicine contact a poison control center or emergency room at once. NOTE: This medicine is only for you. Do not share this medicine with others. What if I miss a dose? It is important not to miss your dose. Call your doctor or health care professional if you are unable to keep an appointment. What may interact with this medicine? This medicine may interact with the following medications:  other iron products This list may not describe all possible interactions. Give your health care provider a list of all the medicines, herbs, non-prescription drugs, or dietary supplements you use. Also tell them if you smoke, drink alcohol, or use illegal drugs. Some items may interact with your medicine. What should I watch for while using this medicine? Visit your doctor or healthcare professional regularly. Tell your doctor or healthcare  professional if your symptoms do not start to get better or if they get worse. You may need blood work done while you are taking this medicine. You may need to follow a special diet. Talk to your doctor. Foods that contain iron include: whole grains/cereals, dried fruits, beans, or peas, leafy green vegetables, and organ meats (liver, kidney). What side effects may I notice from receiving this medicine? Side effects that you should report to your doctor or health care professional as soon as possible:  allergic reactions like skin rash, itching or hives, swelling of the face, lips, or tongue  breathing problems  changes in blood pressure  feeling faint or lightheaded, falls  fever or chills  flushing, sweating, or hot feelings  swelling of the ankles or feet Side effects that usually do not require medical attention (report to your doctor or health care professional if they continue or are bothersome):  diarrhea  headache  nausea, vomiting  stomach pain This list may not describe all possible side effects. Call your doctor for medical advice about side effects. You may report side effects to FDA at 1-800-FDA-1088. Where should I keep my medicine? This drug is given in a hospital or clinic and will not be stored at home. NOTE: This sheet is a summary. It may not cover all possible information. If you have questions about this medicine, talk to your doctor, pharmacist, or health care provider.  2021 Elsevier/Gold Standard (2016-12-06 20:21:10)  

## 2020-12-19 DIAGNOSIS — Z03818 Encounter for observation for suspected exposure to other biological agents ruled out: Secondary | ICD-10-CM | POA: Diagnosis not present

## 2020-12-19 DIAGNOSIS — Z20822 Contact with and (suspected) exposure to covid-19: Secondary | ICD-10-CM | POA: Diagnosis not present

## 2020-12-24 DIAGNOSIS — E538 Deficiency of other specified B group vitamins: Secondary | ICD-10-CM | POA: Diagnosis not present

## 2020-12-24 DIAGNOSIS — Z6841 Body Mass Index (BMI) 40.0 and over, adult: Secondary | ICD-10-CM | POA: Diagnosis not present

## 2020-12-24 DIAGNOSIS — D649 Anemia, unspecified: Secondary | ICD-10-CM | POA: Diagnosis not present

## 2020-12-24 DIAGNOSIS — K047 Periapical abscess without sinus: Secondary | ICD-10-CM | POA: Diagnosis not present

## 2020-12-24 DIAGNOSIS — B070 Plantar wart: Secondary | ICD-10-CM | POA: Diagnosis not present

## 2021-03-27 DIAGNOSIS — R110 Nausea: Secondary | ICD-10-CM | POA: Diagnosis not present

## 2021-03-27 DIAGNOSIS — G43909 Migraine, unspecified, not intractable, without status migrainosus: Secondary | ICD-10-CM | POA: Diagnosis not present

## 2021-03-27 DIAGNOSIS — Z139 Encounter for screening, unspecified: Secondary | ICD-10-CM | POA: Diagnosis not present

## 2021-03-27 DIAGNOSIS — D649 Anemia, unspecified: Secondary | ICD-10-CM | POA: Diagnosis not present

## 2021-03-27 DIAGNOSIS — I10 Essential (primary) hypertension: Secondary | ICD-10-CM | POA: Diagnosis not present

## 2021-03-27 DIAGNOSIS — E538 Deficiency of other specified B group vitamins: Secondary | ICD-10-CM | POA: Diagnosis not present

## 2021-03-27 DIAGNOSIS — E785 Hyperlipidemia, unspecified: Secondary | ICD-10-CM | POA: Diagnosis not present

## 2021-03-27 DIAGNOSIS — E118 Type 2 diabetes mellitus with unspecified complications: Secondary | ICD-10-CM | POA: Diagnosis not present

## 2021-03-27 DIAGNOSIS — R928 Other abnormal and inconclusive findings on diagnostic imaging of breast: Secondary | ICD-10-CM | POA: Diagnosis not present

## 2021-03-27 DIAGNOSIS — Z6841 Body Mass Index (BMI) 40.0 and over, adult: Secondary | ICD-10-CM | POA: Diagnosis not present

## 2021-03-27 DIAGNOSIS — F32A Depression, unspecified: Secondary | ICD-10-CM | POA: Diagnosis not present

## 2021-03-27 DIAGNOSIS — Z79899 Other long term (current) drug therapy: Secondary | ICD-10-CM | POA: Diagnosis not present

## 2021-03-27 DIAGNOSIS — Z9181 History of falling: Secondary | ICD-10-CM | POA: Diagnosis not present

## 2021-04-06 ENCOUNTER — Encounter: Payer: Self-pay | Admitting: Hematology and Oncology

## 2021-05-05 DIAGNOSIS — Z20822 Contact with and (suspected) exposure to covid-19: Secondary | ICD-10-CM | POA: Diagnosis not present

## 2021-05-11 ENCOUNTER — Inpatient Hospital Stay: Payer: Medicare Other | Attending: Nurse Practitioner

## 2021-05-11 ENCOUNTER — Inpatient Hospital Stay: Payer: Medicare Other | Admitting: Hematology and Oncology

## 2021-05-11 NOTE — Progress Notes (Deleted)
[facility]  [address] [place],  [place]  [address] [phone]  Clinic Day:  05/11/2021  Referring physician: Penelope Coop, FNP   CHIEF COMPLAINT:  CC: Iron deficiency  Current Treatment:  Observation   HISTORY OF PRESENT ILLNESS:  Tammy Dickson is a 75 y.o. female with a history of  iron deficiency anemia.  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  She does not tolerate oral iron supplementation.  She also has B12 deficiency, for which she is on B12 injections monthly.  In May 2014, colonoscopy and EGD did not reveal any source of blood loss.  She received IV iron in 2014, but did not follow up with our office.  She will was referred back in November 2017 with recurrent iron deficiency.  She received IV iron in the form of Feraheme in November 2017.  She had a good response to the IV iron with normalization of her hemoglobin.  She had an EGD in February, which revealed persistent Barrett 's esophagus.  She had persistent iron deficiency at her visit in September, but as she was not anemic, further IV iron was not given.  She is here for routine follow-up and states she has worsening fatigue.  Since her last visit, she had a repeat colonoscopy, which did not reveal any obvious source of blood loss.  She did have removal of 2 tubular adenomas.  Repeat colonoscopy in 3 years was recommended.  She denies any respiratory or gastrointestinal symptoms.  She denies any overt form of blood loss.  She states her appetite is good and she is eating well.  When she was seen in December of 2018, her hemoglobin had drifted back down to 11.3 and the studies were consistent with iron deficiency, with a ferritin of 5 and a saturation of 9%.  She therefore was given intravenous Feraheme again.  She continues on B12 injections.  She says she is having less fatigue now.  She does have known Barrett's esophagus and  her last esophageal dilation was 2 years ago and she feels she needs it done again, as she is having occasional choking episodes..       INTERVAL HISTORY:  Tammy Dickson is here today for routine follow up of her iron deficiency. She denies fevers or chills. She  denies pain. Her appetite is good. Her weight has decreased 10 pounds over last 8 months . She experienced a hard fall and injured her knee. This happened about 5 weeks ago and was attributed to her gabapentin 800 mg that she was taking twice daily. She was instructed to only take that at night. She states this still makes her feel unsteady. 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 She has chronic itching of her skin which has worsened over the last few days.  She states she feels like her iron is low again as she has noted some light headedness and low energy. CBC and CMP today are unremarkable. Iron studies reveal iron 34.0, saturation 11.1, and ferritin 12.6.   REVIEW OF SYSTEMS:  Review of Systems  Constitutional:  Positive for appetite change. Negative for chills, diaphoresis, fatigue, fever and unexpected weight change.  HENT:   Negative for hearing loss, lump/mass, mouth sores, nosebleeds, sore throat, tinnitus, trouble swallowing and voice change.   Eyes:  Negative for eye problems and icterus.  Respiratory:  Negative for chest tightness, cough, hemoptysis,  shortness of breath and wheezing.   Cardiovascular:  Negative for chest pain, leg swelling and palpitations.  Gastrointestinal:  Negative for abdominal distention, abdominal pain, blood in stool, constipation, diarrhea, nausea, rectal pain and vomiting.  Endocrine: Negative for hot flashes.  Genitourinary:  Negative for bladder incontinence, difficulty urinating, dyspareunia, dysuria, frequency, hematuria and nocturia.   Musculoskeletal:  Negative for arthralgias, back pain, flank pain, gait problem, myalgias, neck pain and neck stiffness.  Skin:  Positive  for itching. Negative for rash and wound.  Neurological:  Negative for dizziness, extremity weakness, gait problem, headaches, light-headedness, numbness, seizures and speech difficulty.  Hematological:  Negative for adenopathy. Does not bruise/bleed easily.  Psychiatric/Behavioral:  Negative for confusion, decreased concentration, depression, sleep disturbance and suicidal ideas. The patient is not nervous/anxious.     VITALS:  There were no vitals taken for this visit.  Wt Readings from Last 3 Encounters:  12/04/20 250 lb 8 oz (113.6 kg)  11/28/20 253 lb 12 oz (115.1 kg)  11/11/20 253 lb 11.2 oz (115.1 kg)    There is no height or weight on file to calculate BMI.  Performance status (ECOG): 1 - Symptomatic but completely ambulatory  PHYSICAL EXAM:  Physical Exam Constitutional:      General: She is not in acute distress.    Appearance: Normal appearance. She is normal weight. She is not ill-appearing, toxic-appearing or diaphoretic.  HENT:     Head: Normocephalic and atraumatic.     Right Ear: Tympanic membrane normal.     Left Ear: Tympanic membrane normal.     Nose: Nose normal. No congestion or rhinorrhea.     Mouth/Throat:     Mouth: Mucous membranes are moist.     Pharynx: Oropharynx is clear. No oropharyngeal exudate or posterior oropharyngeal erythema.  Eyes:     General: No scleral icterus.       Right eye: No discharge.        Left eye: No discharge.     Extraocular Movements: Extraocular movements intact.     Conjunctiva/sclera: Conjunctivae normal.     Pupils: Pupils are equal, round, and reactive to light.  Neck:     Vascular: No carotid bruit.  Cardiovascular:     Rate and Rhythm: Normal rate and regular rhythm.     Heart sounds: No murmur heard.   No friction rub. No gallop.  Pulmonary:     Effort: Pulmonary effort is normal. No respiratory distress.     Breath sounds: Normal breath sounds. No stridor. No wheezing, rhonchi or rales.  Chest:     Chest  wall: No tenderness.  Abdominal:     General: Abdomen is flat. Bowel sounds are normal. There is no distension.     Palpations: There is no mass.     Tenderness: There is no abdominal tenderness. There is no right CVA tenderness, left CVA tenderness, guarding or rebound.     Hernia: No hernia is present.  Musculoskeletal:        General: No swelling, tenderness, deformity or signs of injury. Normal range of motion.     Cervical back: Normal range of motion and neck supple. No rigidity or tenderness.     Right lower leg: No edema.     Left lower leg: No edema.  Lymphadenopathy:     Cervical: No cervical adenopathy.  Skin:    General: Skin is warm and dry.     Capillary Refill: Capillary refill takes less than 2 seconds.  Coloration: Skin is not jaundiced or pale.     Findings: No bruising, erythema, lesion or rash.  Neurological:     General: No focal deficit present.     Mental Status: She is alert and oriented to person, place, and time. Mental status is at baseline.     Cranial Nerves: No cranial nerve deficit.     Sensory: No sensory deficit.     Motor: No weakness.     Coordination: Coordination normal.     Gait: Gait normal.     Deep Tendon Reflexes: Reflexes normal.  Psychiatric:        Mood and Affect: Mood normal.        Behavior: Behavior normal.        Thought Content: Thought content normal.        Judgment: Judgment normal.   Lymph nodes:   There is no cervical, clavicular, axillary or inguinal lymphadenopathy.   LABS:   CBC Latest Ref Rng & Units 11/11/2020  WBC - 7.3  Hemoglobin 12.0 - 16.0 11.5(A)  Hematocrit 36 - 46 36  Platelets 150 - 399 338   CMP Latest Ref Rng & Units 11/11/2020  BUN 4 - 21 10  Creatinine 0.5 - 1.1 0.7  Sodium 137 - 147 139  Potassium 3.4 - 5.3 3.9  Chloride 99 - 108 106  CO2 13 - 22 25(A)  Calcium 8.7 - 10.7 9.4  Alkaline Phos 25 - 125 124  AST 13 - 35 23  ALT 7 - 35 14     No results found for: CEA1 / No results found  for: CEA1 No results found for: PSA1 No results found for: NTI144 No results found for: RXV400  No results found for: TOTALPROTELP, ALBUMINELP, A1GS, A2GS, BETS, BETA2SER, GAMS, MSPIKE, SPEI Lab Results  Component Value Date   TIBC 304 11/11/2020   FERRITIN 12.6 11/11/2020   IRONPCTSAT 11.1 11/11/2020   No results found for: LDH  STUDIES:  No results found.    HISTORY:  No past medical history on file.    No family history on file.  Social History:  reports that she quit smoking about 17 years ago. Her smoking use included cigarettes. She started smoking about 42 years ago. She has a 12.50 pack-year smoking history. She has never used smokeless tobacco. No history on file for alcohol use and drug use.The patient is alone  today.  Allergies:    Current Medications: Current Outpatient Medications  Medication Sig Dispense Refill   aspirin EC 81 MG tablet Take 81 mg by mouth daily. Swallow whole.     Budesonide-Formoterol Fumarate (SYMBICORT IN) Inhale 1 puff into the lungs as needed.     diphenhydrAMINE (BENADRYL) 25 MG tablet Take 25 mg by mouth every 4 (four) hours as needed.     gabapentin (NEURONTIN) 800 MG tablet Take 800 mg by mouth 2 (two) times daily.     hydrOXYzine (ATARAX/VISTARIL) 10 MG tablet Take 1 tablet (10 mg total) by mouth 3 (three) times daily as needed. 30 tablet 0   losartan (COZAAR) 50 MG tablet Take 50 mg by mouth daily.     omeprazole (PRILOSEC) 40 MG capsule Take 40 mg by mouth daily.     ondansetron (ZOFRAN) 8 MG tablet Take 8 mg by mouth every 4 (four) hours as needed for nausea or vomiting.     venlafaxine (EFFEXOR) 75 MG tablet Take 75 mg by mouth 2 (two) times daily.     No current  facility-administered medications for this visit.     ASSESSMENT & PLAN:   Assessment/Plan:  Tammy Dickson is a 75 y.o. female with recurrent iron deficiency. Her iron saturation is low today at 11.1, so we will schedule for IV iron. I will send in low dose  Vistaril for itching. I suggested splitting her gabapentin into 3 doses of 300 throughout the day to see if that will ease her unsteadiness while still controlling her neuropathy. She will return to clinic in 6 months for repeat CBC, CMP, and iron studies.    The patient understands the plans discussed today and is in agreement with them.    The patient knows to contact our office if she develops concerns prior to her next appointment.   I provided 30 minutes of face-to-face time during this this encounter and > 50% was spent counseling as documented under my assessment and plan.    Melodye Ped, NP

## 2021-07-06 DIAGNOSIS — Z20822 Contact with and (suspected) exposure to covid-19: Secondary | ICD-10-CM | POA: Diagnosis not present

## 2021-07-16 DIAGNOSIS — I10 Essential (primary) hypertension: Secondary | ICD-10-CM | POA: Diagnosis not present

## 2021-07-16 DIAGNOSIS — E1140 Type 2 diabetes mellitus with diabetic neuropathy, unspecified: Secondary | ICD-10-CM | POA: Diagnosis not present

## 2021-07-16 DIAGNOSIS — F3341 Major depressive disorder, recurrent, in partial remission: Secondary | ICD-10-CM | POA: Diagnosis not present

## 2021-07-16 DIAGNOSIS — G63 Polyneuropathy in diseases classified elsewhere: Secondary | ICD-10-CM | POA: Diagnosis not present

## 2021-07-16 DIAGNOSIS — E538 Deficiency of other specified B group vitamins: Secondary | ICD-10-CM | POA: Diagnosis not present

## 2021-07-16 DIAGNOSIS — E782 Mixed hyperlipidemia: Secondary | ICD-10-CM | POA: Diagnosis not present

## 2021-07-16 DIAGNOSIS — Z79899 Other long term (current) drug therapy: Secondary | ICD-10-CM | POA: Diagnosis not present

## 2021-07-16 DIAGNOSIS — N952 Postmenopausal atrophic vaginitis: Secondary | ICD-10-CM | POA: Diagnosis not present

## 2021-07-24 ENCOUNTER — Telehealth: Payer: Self-pay | Admitting: Hematology and Oncology

## 2021-07-24 NOTE — Telephone Encounter (Signed)
Patient called to scheduled Appt to have her Iron Levels checked.  Scheduled patient for 9/27 Labs 1:00 pm - Follow Up w/Melissa 1:30 pm

## 2021-07-28 ENCOUNTER — Ambulatory Visit: Payer: Medicare Other | Admitting: Hematology and Oncology

## 2021-07-28 ENCOUNTER — Other Ambulatory Visit: Payer: Medicare Other

## 2021-08-04 ENCOUNTER — Inpatient Hospital Stay: Payer: Medicare Other | Attending: Nurse Practitioner

## 2021-08-04 ENCOUNTER — Inpatient Hospital Stay: Payer: Medicare Other | Admitting: Hematology and Oncology

## 2021-08-19 DIAGNOSIS — M659 Synovitis and tenosynovitis, unspecified: Secondary | ICD-10-CM | POA: Diagnosis not present

## 2021-08-19 DIAGNOSIS — G5602 Carpal tunnel syndrome, left upper limb: Secondary | ICD-10-CM | POA: Diagnosis not present

## 2021-08-28 DIAGNOSIS — E119 Type 2 diabetes mellitus without complications: Secondary | ICD-10-CM | POA: Diagnosis not present

## 2021-08-28 DIAGNOSIS — G5602 Carpal tunnel syndrome, left upper limb: Secondary | ICD-10-CM | POA: Diagnosis not present

## 2021-08-28 DIAGNOSIS — G5622 Lesion of ulnar nerve, left upper limb: Secondary | ICD-10-CM | POA: Diagnosis not present

## 2021-09-08 DIAGNOSIS — M659 Synovitis and tenosynovitis, unspecified: Secondary | ICD-10-CM | POA: Diagnosis not present

## 2021-09-08 DIAGNOSIS — G5622 Lesion of ulnar nerve, left upper limb: Secondary | ICD-10-CM | POA: Diagnosis not present

## 2021-09-08 DIAGNOSIS — G5602 Carpal tunnel syndrome, left upper limb: Secondary | ICD-10-CM | POA: Diagnosis not present

## 2021-11-10 DIAGNOSIS — G5622 Lesion of ulnar nerve, left upper limb: Secondary | ICD-10-CM | POA: Diagnosis not present

## 2021-11-10 DIAGNOSIS — M659 Synovitis and tenosynovitis, unspecified: Secondary | ICD-10-CM | POA: Diagnosis not present

## 2021-11-10 DIAGNOSIS — G5602 Carpal tunnel syndrome, left upper limb: Secondary | ICD-10-CM | POA: Diagnosis not present

## 2021-11-27 DIAGNOSIS — E538 Deficiency of other specified B group vitamins: Secondary | ICD-10-CM | POA: Diagnosis not present

## 2021-11-27 DIAGNOSIS — J45909 Unspecified asthma, uncomplicated: Secondary | ICD-10-CM | POA: Diagnosis not present

## 2021-11-27 DIAGNOSIS — M1990 Unspecified osteoarthritis, unspecified site: Secondary | ICD-10-CM | POA: Diagnosis not present

## 2021-11-27 DIAGNOSIS — Z886 Allergy status to analgesic agent status: Secondary | ICD-10-CM | POA: Diagnosis not present

## 2021-11-27 DIAGNOSIS — Z6841 Body Mass Index (BMI) 40.0 and over, adult: Secondary | ICD-10-CM | POA: Diagnosis not present

## 2021-11-27 DIAGNOSIS — Z79899 Other long term (current) drug therapy: Secondary | ICD-10-CM | POA: Diagnosis not present

## 2021-11-27 DIAGNOSIS — G8918 Other acute postprocedural pain: Secondary | ICD-10-CM | POA: Diagnosis not present

## 2021-11-27 DIAGNOSIS — F419 Anxiety disorder, unspecified: Secondary | ICD-10-CM | POA: Diagnosis not present

## 2021-11-27 DIAGNOSIS — G43909 Migraine, unspecified, not intractable, without status migrainosus: Secondary | ICD-10-CM | POA: Diagnosis not present

## 2021-11-27 DIAGNOSIS — G479 Sleep disorder, unspecified: Secondary | ICD-10-CM | POA: Diagnosis not present

## 2021-11-27 DIAGNOSIS — Z7982 Long term (current) use of aspirin: Secondary | ICD-10-CM | POA: Diagnosis not present

## 2021-11-27 DIAGNOSIS — F32A Depression, unspecified: Secondary | ICD-10-CM | POA: Diagnosis not present

## 2021-11-27 DIAGNOSIS — Z882 Allergy status to sulfonamides status: Secondary | ICD-10-CM | POA: Diagnosis not present

## 2021-11-27 DIAGNOSIS — G5612 Other lesions of median nerve, left upper limb: Secondary | ICD-10-CM | POA: Diagnosis not present

## 2021-11-27 DIAGNOSIS — K219 Gastro-esophageal reflux disease without esophagitis: Secondary | ICD-10-CM | POA: Diagnosis not present

## 2021-11-27 DIAGNOSIS — G5602 Carpal tunnel syndrome, left upper limb: Secondary | ICD-10-CM | POA: Diagnosis not present

## 2021-11-27 DIAGNOSIS — Z8701 Personal history of pneumonia (recurrent): Secondary | ICD-10-CM | POA: Diagnosis not present

## 2021-11-27 DIAGNOSIS — D649 Anemia, unspecified: Secondary | ICD-10-CM | POA: Diagnosis not present

## 2021-11-27 DIAGNOSIS — I10 Essential (primary) hypertension: Secondary | ICD-10-CM | POA: Diagnosis not present

## 2021-11-27 DIAGNOSIS — Z9049 Acquired absence of other specified parts of digestive tract: Secondary | ICD-10-CM | POA: Diagnosis not present

## 2021-11-27 DIAGNOSIS — E119 Type 2 diabetes mellitus without complications: Secondary | ICD-10-CM | POA: Diagnosis not present

## 2021-11-27 DIAGNOSIS — G5622 Lesion of ulnar nerve, left upper limb: Secondary | ICD-10-CM | POA: Diagnosis not present

## 2021-11-27 DIAGNOSIS — R5381 Other malaise: Secondary | ICD-10-CM | POA: Diagnosis not present

## 2021-11-27 DIAGNOSIS — E559 Vitamin D deficiency, unspecified: Secondary | ICD-10-CM | POA: Diagnosis not present

## 2021-12-23 DIAGNOSIS — R739 Hyperglycemia, unspecified: Secondary | ICD-10-CM | POA: Diagnosis not present

## 2021-12-23 DIAGNOSIS — J45909 Unspecified asthma, uncomplicated: Secondary | ICD-10-CM | POA: Diagnosis not present

## 2021-12-23 DIAGNOSIS — Z9889 Other specified postprocedural states: Secondary | ICD-10-CM | POA: Diagnosis not present

## 2021-12-23 DIAGNOSIS — M545 Low back pain, unspecified: Secondary | ICD-10-CM | POA: Diagnosis not present

## 2021-12-23 DIAGNOSIS — K2270 Barrett's esophagus without dysplasia: Secondary | ICD-10-CM | POA: Diagnosis not present

## 2021-12-23 DIAGNOSIS — E538 Deficiency of other specified B group vitamins: Secondary | ICD-10-CM | POA: Diagnosis not present

## 2021-12-23 DIAGNOSIS — D509 Iron deficiency anemia, unspecified: Secondary | ICD-10-CM | POA: Diagnosis not present

## 2021-12-23 DIAGNOSIS — G8929 Other chronic pain: Secondary | ICD-10-CM | POA: Diagnosis not present

## 2021-12-23 DIAGNOSIS — E785 Hyperlipidemia, unspecified: Secondary | ICD-10-CM | POA: Diagnosis not present

## 2021-12-28 ENCOUNTER — Other Ambulatory Visit: Payer: Self-pay

## 2021-12-28 HISTORY — PX: CARPAL TUNNEL RELEASE: SHX101

## 2021-12-30 ENCOUNTER — Other Ambulatory Visit: Payer: Self-pay

## 2021-12-30 ENCOUNTER — Encounter: Payer: Self-pay | Admitting: Hematology and Oncology

## 2021-12-30 ENCOUNTER — Inpatient Hospital Stay: Payer: Medicare Other | Attending: Hematology and Oncology | Admitting: Hematology and Oncology

## 2021-12-30 VITALS — BP 147/82 | HR 99 | Temp 98.2°F | Resp 18 | Ht 63.0 in | Wt 241.3 lb

## 2021-12-30 DIAGNOSIS — D509 Iron deficiency anemia, unspecified: Secondary | ICD-10-CM | POA: Insufficient documentation

## 2021-12-30 DIAGNOSIS — R928 Other abnormal and inconclusive findings on diagnostic imaging of breast: Secondary | ICD-10-CM | POA: Diagnosis not present

## 2021-12-30 DIAGNOSIS — D508 Other iron deficiency anemias: Secondary | ICD-10-CM | POA: Diagnosis not present

## 2021-12-30 DIAGNOSIS — Z79899 Other long term (current) drug therapy: Secondary | ICD-10-CM | POA: Insufficient documentation

## 2021-12-30 HISTORY — DX: Other abnormal and inconclusive findings on diagnostic imaging of breast: R92.8

## 2021-12-30 NOTE — Assessment & Plan Note (Signed)
Probable complex cyst in the left breast, her last mammogram was in September 2021 and 75-month follow-up diagnostic mammogram and possible ultrasound of the left breast were recommended.  She is willing to undergo mammogram at this time, so we will schedule her for bilateral diagnostic mammogram and possible left breast ultrasound. ?

## 2021-12-30 NOTE — Assessment & Plan Note (Addendum)
Iron deficiency felt to be due to decreased absorption due to previous bariatric surgery.  She does not tolerate oral iron supplementation.  EGD and colonoscopy in May 2014 did not reveal any source of blood loss.  She received IV iron in 2014, but did not follow up with our office.  She will was referred back in November 2017 with recurrent iron deficiency.  She has received IV iron in the form of Feraheme intermittently.  She failed to follow-up again between May 2021 and January 2022.  She received Feraheme again in January 2022 and had not followed up since. She is once again iron deficient, so will receive IV Feraheme again at this time.  I recommended that she see Dr. Lyda Jester as soon as possible.  She would not let me schedule an appointment today as she is recovering from left wrist elbow surgery.  I strongly encouraged her to f/u with [place] as scheduled.  As she usually has an excellent response to [facility], I will plan to see her back in 6 months with a CBC, iron panel and ferritin.  She knows she can contact our office if she develops symptoms of recurrent anemia prior to that appointment. ?

## 2021-12-30 NOTE — Progress Notes (Signed)
[name]  [address] [place],  [place]  [address] [phone]  Clinic Day:  12/30/2021  Referring physician: Penelope Coop, FNP  ASSESSMENT & PLAN:   Assessment & Plan: Iron deficiency anemia Iron deficiency felt to be due to decreased absorption due to previous bariatric surgery.  She does not tolerate oral iron supplementation.  EGD and colonoscopy in May 2014 did not reveal any source of blood loss.  She received IV iron in 2014, but did not follow up with our office.  She will was referred back in November 2017 with recurrent iron deficiency.  She has received IV iron in the form of Feraheme intermittently.  She failed to follow-up again between May 2021 and January 2022.  She received Feraheme again in January 2022 and had not followed up since. She is once again iron deficient, so will receive IV Feraheme again at this time.  I recommended that she see Dr. Lyda Jester as soon as possible.  She would not let me schedule an appointment today as she is recovering from left wrist elbow surgery.  I strongly encouraged her to f/u with [place] as scheduled.  As she usually has an excellent response to [facility], I will plan to see her back in 6 months with a CBC, iron panel and ferritin.  She knows she can contact our office if she develops symptoms of recurrent anemia prior to that appointment.  Abnormal mammogram Probable complex cyst in the left breast, her last mammogram was in September 2021 and 40-month follow-up diagnostic mammogram and possible ultrasound of the left breast were recommended.  She is willing to undergo mammogram at this time, so we will schedule her for bilateral diagnostic mammogram and possible left breast ultrasound.   The patient understands the plans discussed today and is in agreement with them.  She knows to contact our office if she develops concerns prior to her next appointment.     Tammy Pickles, PA-C  [facility] AT [facility] [address] [place] [address] Dept: [phone] Dept Fax: [phone]   Orders Placed This Encounter  Procedures   MM Digital Diagnostic Bilat    Standing Status:   Future    Standing Expiration Date:   12/30/2022    Scheduling Instructions:     RH    Order Specific Question:   Reason for exam:    Answer:   f/u abnormal mammo from 07/2020    Order Specific Question:   Preferred imaging location?    Answer:   External   US BREAST ASPIRATION LEFT    Standing Status:   Future    Standing Expiration Date:   12/30/2022    Scheduling Instructions:     RH    Order Specific Question:   Reason for Exam (SYMPTOM  OR DIAGNOSIS REQUIRED)    Answer:   f/u abnormal mammo from 07/2020    Order Specific Question:   Preferred imaging location?    Answer:   External      CHIEF COMPLAINT:  CC: Iron deficiency  Current Treatment:  Feraheme as needed  HISTORY OF PRESENT ILLNESS:  Tammy Dickson is a 76 year old female with a history of  iron deficiency anemia.  We feel she does not adequately absorb iron from her diet due to previous bariatric surgery.  She does not tolerate oral iron supplementation.  She also has B12 deficiency, for which she is on B12  injections monthly.  In May 2014, colonoscopy and EGD did not reveal any source of blood loss.  She received IV iron in 2014, but did not follow up with our office.  She will was referred back in November 2017 with recurrent iron deficiency.  She received IV iron in the form of Feraheme in November 2017.  She had a good response to the IV iron with normalization of her hemoglobin.  She has intermittently required IV iron over the years, but has not followed up in our office regularly as recommended.  She was last seen in January 2022 with recurrent iron deficiency and received IV Feraheme at that time.  EGD in February 2018 revealed persistent Barrett 's esophagus.  Colonoscopy in  November 2018 did not reveal any obvious source of blood loss, but she had removal of 2 tubular adenomas.  Repeat colonoscopy in 3 years was recommended. Repeat EGD in October 2020 revealed persistent Barrett's esophagus, no evidence of malignancy.  INTERVAL HISTORY:  Tammy Dickson is referred back with recurrent iron deficiency.  She saw Myrtie Soman, FNP, on February 22nd to establish primary care.  A CBC revealed a hemoglobin of 9.4 with an hematocrit of 75, normal white count and platelets.  Iron studies revealed a TIBC of 402 mcg/dL, iron saturation 5% and ferritin 6 ng/mL, consistent with iron deficiency.  Her B12 level was 185 which is the lower limit of normal at their lab.  Complete metabolic panel was unremarkable except for alkaline phosphatase 131, upper limit of normal 104.  She reports fatigue, dyspnea with exertion and pica to ice.  She denies fevers or chills. She denies pain. Her appetite is good. Her weight has decreased 9 pounds over last year. She has not had repeat EGD since 2020 or colonoscopy since 2018.  Review of her records reveals her last mammogram to be in September 2021, at which time 55-month follow-up was recommended due to complex cyst in the left breast.   REVIEW OF SYSTEMS:  Review of Systems  Constitutional:  Positive for fatigue. Negative for appetite change, chills, fever and unexpected weight change.  HENT:   Negative for lump/mass, mouth sores and sore throat.   Respiratory:  Positive for shortness of breath (With exertion). Negative for cough.   Cardiovascular:  Negative for chest pain and leg swelling.  Gastrointestinal:  Negative for abdominal pain, constipation, diarrhea, nausea and vomiting.  Endocrine: Negative for hot flashes.  Genitourinary:  Negative for difficulty urinating, dysuria, frequency and hematuria.   Musculoskeletal:  Positive for arthralgias (Left wrist and elbow). Negative for back pain and myalgias.  Skin:  Negative for rash.  Neurological:   Negative for dizziness and headaches.  Hematological:  Negative for adenopathy. Does not bruise/bleed easily.  Psychiatric/Behavioral:  Positive for depression. Negative for sleep disturbance. The patient is nervous/anxious.     VITALS:  Blood pressure (!) 147/82, pulse 99, temperature 98.2 F (36.8 C), temperature source Oral, resp. rate 18, height 5\' 3"  (1.6 m), weight 241 lb 4.8 oz (109.5 kg), SpO2 97 %.  Wt Readings from Last 3 Encounters:  12/30/21 241 lb 4.8 oz (109.5 kg)  12/04/20 250 lb 8 oz (113.6 kg)  11/28/20 253 lb 12 oz (115.1 kg)    Body mass index is 42.74 kg/m.  Performance status (ECOG): 1 - Symptomatic but completely ambulatory  PHYSICAL EXAM:  Physical Exam Vitals and nursing note reviewed.  Constitutional:      General: She is not in acute distress.    Appearance:  Normal appearance.  HENT:     Head: Normocephalic and atraumatic.     Mouth/Throat:     Mouth: Mucous membranes are moist.     Pharynx: Oropharynx is clear. No oropharyngeal exudate or posterior oropharyngeal erythema.  Eyes:     General: No scleral icterus.    Extraocular Movements: Extraocular movements intact.     Conjunctiva/sclera: Conjunctivae normal.     Pupils: Pupils are equal, round, and reactive to light.  Cardiovascular:     Rate and Rhythm: Normal rate and regular rhythm.     Heart sounds: Normal heart sounds. No murmur heard.   No friction rub. No gallop.  Pulmonary:     Effort: Pulmonary effort is normal.     Breath sounds: Normal breath sounds. No wheezing, rhonchi or rales.  Abdominal:     General: There is no distension.     Palpations: Abdomen is soft. There is no hepatomegaly, splenomegaly or mass.     Tenderness: There is no abdominal tenderness.  Musculoskeletal:        General: Normal range of motion.     Cervical back: Normal range of motion and neck supple. No tenderness.     Right lower leg: No edema.     Left lower leg: No edema.  Lymphadenopathy:     Cervical:  No cervical adenopathy.     Upper Body:     Right upper body: No supraclavicular or axillary adenopathy.     Left upper body: No supraclavicular or axillary adenopathy.     Lower Body: No right inguinal adenopathy. No left inguinal adenopathy.  Skin:    General: Skin is warm and dry.     Coloration: Skin is not jaundiced.     Findings: No rash.  Neurological:     Mental Status: She is alert and oriented to person, place, and time.     Cranial Nerves: No cranial nerve deficit.  Psychiatric:        Mood and Affect: Mood normal.        Behavior: Behavior normal.        Thought Content: Thought content normal.    LABS:   CBC Latest Ref Rng & Units 11/11/2020  WBC - 7.3  Hemoglobin 12.0 - 16.0 11.5(A)  Hematocrit 36 - 46 36  Platelets 150 - 399 338   CMP Latest Ref Rng & Units 11/11/2020  BUN 4 - 21 10  Creatinine 0.5 - 1.1 0.7  Sodium 137 - 147 139  Potassium 3.4 - 5.3 3.9  Chloride 99 - 108 106  CO2 13 - 22 25(A)  Calcium 8.7 - 10.7 9.4  Alkaline Phos 25 - 125 124  AST 13 - 35 23  ALT 7 - 35 14     No results found for: CEA1 / No results found for: CEA1 No results found for: PSA1 No results found for: FWY637 No results found for: CHY850  No results found for: TOTALPROTELP, ALBUMINELP, A1GS, A2GS, BETS, BETA2SER, GAMS, MSPIKE, SPEI Lab Results  Component Value Date   TIBC 304 11/11/2020   FERRITIN 12.6 11/11/2020   IRONPCTSAT 11.1 11/11/2020   No results found for: LDH  STUDIES:  No results found.    HISTORY:   Past Medical History:  Diagnosis Date   Abnormal mammogram 12/30/2021    Past Surgical History:  Procedure Laterality Date   CARPAL TUNNEL RELEASE Right 12/28/2021   right tendon release elbow    History reviewed. No pertinent family history.  Social  History:  reports that she quit smoking about 18 years ago. Her smoking use included cigarettes. She started smoking about 43 years ago. She has a 12.50 pack-year smoking history. She has never used  smokeless tobacco. No history on file for alcohol use and drug use.The patient is alone today.  Allergies:  Allergies  Allergen Reactions   Iron Other (See Comments) and Nausea And Vomiting    vomiting   Sulfa Antibiotics Other (See Comments)    Unknown    Sulfamethoxazole Nausea And Vomiting    Current Medications: Current Outpatient Medications  Medication Sig Dispense Refill   aspirin EC 81 MG tablet Take 81 mg by mouth daily. Swallow whole.     Budesonide-Formoterol Fumarate (SYMBICORT IN) Inhale 1 puff into the lungs as needed.     gabapentin (NEURONTIN) 800 MG tablet Take 800 mg by mouth 2 (two) times daily.     HYDROcodone-acetaminophen (NORCO/VICODIN) 5-325 MG tablet Take by mouth.     hydrOXYzine (ATARAX) 25 MG tablet take 1 to 2 tablets by mouth every 4 to 6 hours if needed for itching     losartan (COZAAR) 50 MG tablet Take 50 mg by mouth daily.     metFORMIN (GLUCOPHAGE) 500 MG tablet      ondansetron (ZOFRAN-ODT) 8 MG disintegrating tablet      pantoprazole (PROTONIX) 40 MG tablet      SUMAtriptan (IMITREX) 100 MG tablet      triamcinolone cream (KENALOG) 0.1 %      venlafaxine (EFFEXOR) 75 MG tablet Take 75 mg by mouth 2 (two) times daily.     No current facility-administered medications for this visit.

## 2022-01-04 ENCOUNTER — Encounter: Payer: Self-pay | Admitting: Hematology and Oncology

## 2022-01-04 MED FILL — Ferumoxytol Inj 510 MG/17ML (30 MG/ML) (Elemental Fe): INTRAVENOUS | Qty: 17 | Status: AC

## 2022-01-05 ENCOUNTER — Inpatient Hospital Stay: Payer: Medicare Other

## 2022-01-05 ENCOUNTER — Other Ambulatory Visit: Payer: Self-pay

## 2022-01-05 VITALS — BP 157/77 | HR 80 | Temp 98.2°F | Resp 18 | Ht 64.0 in | Wt 238.2 lb

## 2022-01-05 DIAGNOSIS — Z79899 Other long term (current) drug therapy: Secondary | ICD-10-CM | POA: Diagnosis not present

## 2022-01-05 DIAGNOSIS — D509 Iron deficiency anemia, unspecified: Secondary | ICD-10-CM

## 2022-01-05 MED ORDER — SODIUM CHLORIDE 0.9 % IV SOLN: Freq: Once | INTRAVENOUS | Status: AC

## 2022-01-05 MED ORDER — SODIUM CHLORIDE 0.9 % IV SOLN
510.0000 mg | Freq: Once | INTRAVENOUS | Status: AC
  Administered 2022-01-05: 510 mg via INTRAVENOUS
  Filled 2022-01-05: qty 510

## 2022-01-05 NOTE — Patient Instructions (Signed)
Iron-Rich Diet Iron is a mineral that helps your body produce hemoglobin. Hemoglobin is a protein in red blood cells that carries oxygen to your body's tissues. Eating too little iron may cause you to feel weak and tired, and it can increase your risk of infection. Iron is naturally found in many foods, and many foods have iron added to them (are iron-fortified). You may need to follow an iron-rich diet if you do not have enough iron in your body due to certain medical conditions. The amount of iron that you need each day depends on your age, your sex, and any medical conditions you have. Follow instructions from your health care provider or a dietitian about how much iron you should eat each day. What are tips for following this plan? Reading food labels Check food labels to see how many milligrams (mg) of iron are in each serving. Cooking Cook foods in pots and pans that are made from iron. Take these steps to make it easier for your body to absorb iron from certain foods: Soak beans overnight before cooking. Soak whole grains overnight and drain them before using. Ferment flours before baking, such as by using yeast in bread dough. Meal planning When you eat foods that contain iron, you should eat them with foods that are high in vitamin C. These include oranges, peppers, tomatoes, potatoes, and mangoes. Vitamin C helps your body absorb iron. Certain foods and drinks prevent your body from absorbing iron properly. Avoid eating these foods in the same meal as iron-rich foods or with iron supplements. These foods include: Coffee, black tea, and red wine. Milk, dairy products, and foods that are high in calcium. Beans and soybeans. Whole grains. General information Take iron supplements only as told by your health care provider. An overdose of iron can be life-threatening. If you were prescribed iron supplements, take them with orange juice or a vitamin C supplement. When you eat iron-fortified  foods or take an iron supplement, you should also eat foods that naturally contain iron, such as meat, poultry, and fish. Eating naturally iron-rich foods helps your body absorb the iron that is added to other foods or contained in a supplement. Iron from animal sources is better absorbed than iron from plant sources. What foods should I eat? Fruits Prunes. Raisins. Eat fruits high in vitamin C, such as oranges, grapefruits, and strawberries, with iron-rich foods. Vegetables Spinach (cooked). Green peas. Broccoli. Fermented vegetables. Eat vegetables high in vitamin C, such as leafy greens, potatoes, bell peppers, and tomatoes, with iron-rich foods. Grains Iron-fortified breakfast cereal. Iron-fortified whole-wheat bread. Enriched rice. Sprouted grains. Meats and other proteins Beef liver. Beef. [place]. Chicken. Oysters. Shrimp. [place]. Sardines. Chickpeas. Nuts. Tofu. Pumpkin seeds. Beverages Tomato juice. Fresh orange juice. Prune juice. Hibiscus tea. Iron-fortified instant breakfast shakes. Sweets and desserts Blackstrap molasses. Seasonings and condiments Tahini. Fermented soy sauce. Other foods Wheat germ. The items listed above may not be a complete list of recommended foods and beverages. Contact a dietitian for more information. What foods should I limit? These are foods that should be limited while eating iron-rich foods as they can reduce the absorption of iron in your body. Grains Whole grains. Bran cereal. Bran flour. Meats and other proteins Soybeans. Products made from soy protein. Black beans. Lentils. Mung beans. Split peas. Dairy Milk. Cream. Cheese. Yogurt. Cottage cheese. Beverages Coffee. Black tea. Red wine. Sweets and desserts Cocoa. Chocolate. Ice cream. Seasonings and condiments Basil. Oregano. Large amounts of parsley. The items listed above  may not be a complete list of foods and beverages you should limit. Contact a dietitian for more  information. Summary Iron is a mineral that helps your body produce hemoglobin. Hemoglobin is a protein in red blood cells that carries oxygen to your body's tissues. Iron is naturally found in many foods, and many foods have iron added to them (are iron-fortified). When you eat foods that contain iron, you should eat them with foods that are high in vitamin C. Vitamin C helps your body absorb iron. Certain foods and drinks prevent your body from absorbing iron properly, such as whole grains and dairy products. You should avoid eating these foods in the same meal as iron-rich foods or with iron supplements. This information is not intended to replace advice given to you by your health care provider. Make sure you discuss any questions you have with your health care provider. Document Revised: 09/29/2020 Document Reviewed: 09/29/2020 Elsevier Patient Education  2022 [employer]. Iron Deficiency Anemia, Adult Iron deficiency anemia is a condition in which the concentration of red blood cells or hemoglobin in the blood is below normal because of too little iron. Hemoglobin is a substance in red blood cells that carries oxygen to the body's tissues. When the concentration of red blood cells or hemoglobin is too low, not enough oxygen reaches these tissues. Iron deficiency anemia is usually long-lasting, and it develops over time. It may or may not cause symptoms. It is a common type of anemia. What are the causes? This condition may be caused by: Not enough iron in the diet. Abnormal absorption in the gut. Increased need for iron because of pregnancy or heavy menstrual periods, for females. Cancers of the gastrointestinal system, such as colon cancer. Blood loss caused by bleeding in the intestine. This may be from a gastrointestinal condition like Crohn's disease. Frequent blood draws, such as from blood donation. What increases the risk? The following factors may make you more likely to develop  this condition: Being pregnant. Being a teenage girl going through a growth spurt. What are the signs or symptoms? Symptoms of this condition may include: Pale skin, lips, and nail beds. Weakness, dizziness, and getting tired easily. Headache. Shortness of breath when moving or exercising. Cold hands and feet. Fast or irregular heartbeat. Irritability or rapid breathing. These are more common in severe anemia. Mild anemia may not cause any symptoms. How is this diagnosed? This condition is diagnosed based on: Your medical history. A physical exam. Blood tests. You may have additional tests to find the underlying cause of your anemia, such as: Testing for blood in the stool (fecal occult blood test). A procedure to see inside your colon and rectum (colonoscopy). A procedure to see inside your esophagus and stomach (endoscopy). A test in which cells are removed from bone marrow (bone marrow aspiration) or fluid is removed from the bone marrow to be examined. This is rarely needed. How is this treated? This condition is treated by correcting the cause of your iron deficiency. Treatment may involve: Adding iron-rich foods to your diet. Taking iron supplements. If you are pregnant or breastfeeding, you may need to take extra iron because your normal diet usually does not provide the amount of iron that you need. Increasing vitamin C intake. Vitamin C helps your body absorb iron. Your health care provider may recommend that you take iron supplements along with a glass of orange juice or a vitamin C supplement. Medicines to make heavy menstrual flow lighter. Surgery. You  may need repeat blood tests to determine whether treatment is working. If the treatment does not seem to be working, you may need more tests. Follow these instructions at home: Medicines Take over-the-counter and prescription medicines only as told by your health care provider. This includes iron supplements and  vitamins. For the best iron absorption, you should take iron supplements when your stomach is empty. If you cannot tolerate them on an empty stomach, you may need to take them with food. Do not drink milk or take antacids at the same time as your iron supplements. Milk and antacids may interfere with iron absorption. Iron supplements may turn stool (feces) a darker color and it may appear black. If you cannot tolerate taking iron supplements by mouth, talk with your health care provider about taking them through an IV or through an injection into a muscle. Eating and drinking  Talk with your health care provider before changing your diet. He or she may recommend that you eat foods that contain a lot of iron, such as: Liver. Low-fat (lean) beef. Breads and cereals that have iron added to them (are fortified). Eggs. Dried fruit. Dark green, leafy vegetables. To help your body use the iron from iron-rich foods, eat those foods at the same time as fresh fruits and vegetables that are high in vitamin C. Foods that are high in vitamin C include: Oranges. Peppers. Tomatoes. Mangoes. Drink enough fluid to keep your urine pale yellow. Managing constipation If you are taking an iron supplement, it may cause constipation. To prevent or treat constipation, you may need to: Take over-the-counter or prescription medicines. Eat foods that are high in fiber, such as beans, whole grains, and fresh fruits and vegetables. Limit foods that are high in fat and processed sugars, such as fried or sweet foods. General instructions Return to your normal activities as told by your health care provider. Ask your health care provider what activities are safe for you. Practice good hygiene. Anemia can make you more prone to illness and infection. Keep all follow-up visits as told by your health care provider. This is important. Contact a health care provider if you: Feel nauseous or you vomit. Feel weak. Have  unexplained sweating. Develop symptoms of constipation, such as: Having fewer than three bowel movements a week. Straining to have a bowel movement. Having stools that are hard, dry, or larger than normal. Feeling full or bloated. Pain in the lower abdomen. Not feeling relief after having a bowel movement. Get help right away if you: Faint. If this happens, do not drive yourself to the hospital. Have chest pain. Have shortness of breath that: Is severe. Gets worse with physical activity. Have an irregular or rapid heartbeat. Become light-headed when getting up from a sitting or lying down position. These symptoms may represent a serious problem that is an emergency. Do not wait to see if the symptoms will go away. Get medical help right away. Call your local emergency services (911 in the [place] Do not drive yourself to the hospital. Summary Iron deficiency anemia is a condition in which the concentration of red blood cells or hemoglobin in the blood is below normal because of too little iron. This condition is treated by correcting the cause of your iron deficiency. Take over-the-counter and prescription medicines only as told by your health care provider. This includes iron supplements and vitamins. To help your body use the iron from iron-rich foods, eat those foods at the same time as fresh fruits and  vegetables that are high in vitamin C. Get help right away if you have shortness of breath that gets worse with physical activity. This information is not intended to replace advice given to you by your health care provider. Make sure you discuss any questions you have with your health care provider. Document Revised: 06/26/2019 Document Reviewed: 06/26/2019 Elsevier Patient Education  [place].

## 2022-01-06 DIAGNOSIS — F5101 Primary insomnia: Secondary | ICD-10-CM | POA: Diagnosis not present

## 2022-01-06 DIAGNOSIS — I10 Essential (primary) hypertension: Secondary | ICD-10-CM | POA: Diagnosis not present

## 2022-01-06 DIAGNOSIS — G8929 Other chronic pain: Secondary | ICD-10-CM | POA: Diagnosis not present

## 2022-01-06 DIAGNOSIS — M545 Low back pain, unspecified: Secondary | ICD-10-CM | POA: Diagnosis not present

## 2022-01-06 DIAGNOSIS — J45909 Unspecified asthma, uncomplicated: Secondary | ICD-10-CM | POA: Diagnosis not present

## 2022-01-06 DIAGNOSIS — E538 Deficiency of other specified B group vitamins: Secondary | ICD-10-CM | POA: Diagnosis not present

## 2022-01-06 DIAGNOSIS — N952 Postmenopausal atrophic vaginitis: Secondary | ICD-10-CM | POA: Diagnosis not present

## 2022-01-06 DIAGNOSIS — N761 Subacute and chronic vaginitis: Secondary | ICD-10-CM | POA: Diagnosis not present

## 2022-01-06 DIAGNOSIS — E119 Type 2 diabetes mellitus without complications: Secondary | ICD-10-CM | POA: Diagnosis not present

## 2022-01-06 DIAGNOSIS — F3341 Major depressive disorder, recurrent, in partial remission: Secondary | ICD-10-CM | POA: Diagnosis not present

## 2022-01-07 DIAGNOSIS — N761 Subacute and chronic vaginitis: Secondary | ICD-10-CM | POA: Diagnosis not present

## 2022-01-12 ENCOUNTER — Encounter: Payer: Self-pay | Admitting: Hematology and Oncology

## 2022-01-12 ENCOUNTER — Inpatient Hospital Stay: Payer: Medicare Other

## 2022-01-12 VITALS — BP 132/71 | HR 86 | Temp 98.1°F | Resp 20 | Ht 64.0 in | Wt 239.2 lb

## 2022-01-12 DIAGNOSIS — D509 Iron deficiency anemia, unspecified: Secondary | ICD-10-CM | POA: Diagnosis not present

## 2022-01-12 DIAGNOSIS — Z79899 Other long term (current) drug therapy: Secondary | ICD-10-CM | POA: Diagnosis not present

## 2022-01-12 MED ORDER — FAMOTIDINE IN NACL 20-0.9 MG/50ML-% IV SOLN
20.0000 mg | Freq: Once | INTRAVENOUS | Status: AC
  Administered 2022-01-12: 20 mg via INTRAVENOUS
  Filled 2022-01-12: qty 50

## 2022-01-12 MED ORDER — ONDANSETRON HCL 4 MG/2ML IJ SOLN
8.0000 mg | Freq: Once | INTRAMUSCULAR | Status: AC
  Administered 2022-01-12: 8 mg via INTRAVENOUS
  Filled 2022-01-12: qty 4

## 2022-01-12 MED ORDER — SODIUM CHLORIDE 0.9 % IV SOLN: Freq: Once | INTRAVENOUS | Status: AC

## 2022-01-12 MED ORDER — SODIUM CHLORIDE 0.9 % IV SOLN
510.0000 mg | Freq: Once | INTRAVENOUS | Status: AC
  Administered 2022-01-12: 510 mg via INTRAVENOUS
  Filled 2022-01-12: qty 510

## 2022-01-12 NOTE — Progress Notes (Signed)
1055-pt states she got nauseated during her last iron infusion but did not want to say anything because she had an appointment afterwards.  Also states she had diarrhea for the rest of the day and had to take immodium to stop it.  Ulice Dash, rph notified.  Orders received to give zofran and pepcid prior to infusion.  Explained to pt that we will have to wait 30 mins after the pepcid is infused to start iron. Also instructed that if she gets diarrhea again to continue taking immodium and if that does not help to call the triage line for the cancer center.  Pt verbalizes understanding of both.   ?1305-pt states she had mild nausea during the infusion and some abd cramping.  Encouraged to use immodium at home as need.  Pt verbalized understanding.   ?

## 2022-01-12 NOTE — Patient Instructions (Signed)
Iron Deficiency Anemia, Adult ?Iron deficiency anemia is a condition in which the concentration of red blood cells or hemoglobin in the blood is below normal because of too little iron. Hemoglobin is a substance in red blood cells that carries oxygen to the body's tissues. When the concentration of red blood cells or hemoglobin is too low, not enough oxygen reaches these tissues. ?Iron deficiency anemia is usually long-lasting, and it develops over time. It may or may not cause symptoms. It is a common type of anemia. ?What are the causes? ?This condition may be caused by: ?Not enough iron in the diet. ?Abnormal absorption in the gut. ?Increased need for iron because of pregnancy or heavy menstrual periods, for females. ?Cancers of the gastrointestinal system, such as colon cancer. ?Blood loss caused by bleeding in the intestine. This may be from a gastrointestinal condition like Crohn's disease. ?Frequent blood draws, such as from blood donation. ?What increases the risk? ?The following factors may make you more likely to develop this condition: ?Being pregnant. ?Being a teenage girl going through a growth spurt. ?What are the signs or symptoms? ?Symptoms of this condition may include: ?Pale skin, lips, and nail beds. ?Weakness, dizziness, and getting tired easily. ?Headache. ?Shortness of breath when moving or exercising. ?Cold hands and feet. ?Fast or irregular heartbeat. ?Irritability or rapid breathing. These are more common in severe anemia. ?Mild anemia may not cause any symptoms. ?How is this diagnosed? ?This condition is diagnosed based on: ?Your medical history. ?A physical exam. ?Blood tests. ?You may have additional tests to find the underlying cause of your anemia, such as: ?Testing for blood in the stool (fecal occult blood test). ?A procedure to see inside your colon and rectum (colonoscopy). ?A procedure to see inside your esophagus and stomach (endoscopy). ?A test in which cells are removed from  bone marrow (bone marrow aspiration) or fluid is removed from the bone marrow to be examined. This is rarely needed. ?How is this treated? ?This condition is treated by correcting the cause of your iron deficiency. Treatment may involve: ?Adding iron-rich foods to your diet. ?Taking iron supplements. If you are pregnant or breastfeeding, you may need to take extra iron because your normal diet usually does not provide the amount of iron that you need. ?Increasing vitamin C intake. Vitamin C helps your body absorb iron. Your health care provider may recommend that you take iron supplements along with a glass of orange juice or a vitamin C supplement. ?Medicines to make heavy menstrual flow lighter. ?Surgery. ?You may need repeat blood tests to determine whether treatment is working. If the treatment does not seem to be working, you may need more tests. ?Follow these instructions at home: ?Medicines ?Take over-the-counter and prescription medicines only as told by your health care provider. This includes iron supplements and vitamins. ?For the best iron absorption, you should take iron supplements when your stomach is empty. If you cannot tolerate them on an empty stomach, you may need to take them with food. ?Do not drink milk or take antacids at the same time as your iron supplements. Milk and antacids may interfere with iron absorption. ?Iron supplements may turn stool (feces) a darker color and it may appear black. ?If you cannot tolerate taking iron supplements by mouth, talk with your health care provider about taking them through an IV or through an injection into a muscle. ?Eating and drinking ? ?Talk with your health care provider before changing your diet. He or she may recommend  that you eat foods that contain a lot of iron, such as: ?Liver. ?Low-fat (lean) beef. ?Breads and cereals that have iron added to them (are fortified). ?Eggs. ?Dried fruit. ?Dark green, leafy vegetables. ?To help your body use the  iron from iron-rich foods, eat those foods at the same time as fresh fruits and vegetables that are high in vitamin C. Foods that are high in vitamin C include: ?Oranges. ?Peppers. ?Tomatoes. ?Mangoes. ?Drink enough fluid to keep your urine pale yellow. ?Managing constipation ?If you are taking an iron supplement, it may cause constipation. To prevent or treat constipation, you may need to: ?Take over-the-counter or prescription medicines. ?Eat foods that are high in fiber, such as beans, whole grains, and fresh fruits and vegetables. ?Limit foods that are high in fat and processed sugars, such as fried or sweet foods. ?General instructions ?Return to your normal activities as told by your health care provider. Ask your health care provider what activities are safe for you. ?Practice good hygiene. Anemia can make you more prone to illness and infection. ?Keep all follow-up visits as told by your health care provider. This is important. ?Contact a health care provider if you: ?Feel nauseous or you vomit. ?Feel weak. ?Have unexplained sweating. ?Develop symptoms of constipation, such as: ?Having fewer than three bowel movements a week. ?Straining to have a bowel movement. ?Having stools that are hard, dry, or larger than normal. ?Feeling full or bloated. ?Pain in the lower abdomen. ?Not feeling relief after having a bowel movement. ?Get help right away if you: ?Faint. If this happens, do not drive yourself to the hospital. ?Have chest pain. ?Have shortness of breath that: ?Is severe. ?Gets worse with physical activity. ?Have an irregular or rapid heartbeat. ?Become light-headed when getting up from a sitting or lying down position. ?These symptoms may represent a serious problem that is an emergency. Do not wait to see if the symptoms will go away. Get medical help right away. Call your local emergency services (911 in the [place] Do not drive yourself to the hospital. ?Summary ?Iron deficiency anemia is a condition in  which the concentration of red blood cells or hemoglobin in the blood is below normal because of too little iron. ?This condition is treated by correcting the cause of your iron deficiency. ?Take over-the-counter and prescription medicines only as told by your health care provider. This includes iron supplements and vitamins. ?To help your body use the iron from iron-rich foods, eat those foods at the same time as fresh fruits and vegetables that are high in vitamin C. ?Get help right away if you have shortness of breath that gets worse with physical activity. ?This information is not intended to replace advice given to you by your health care provider. Make sure you discuss any questions you have with your health care provider. ?Document Revised: 06/26/2019 Document Reviewed: 06/26/2019 ?Elsevier Patient Education ? [place]. ?Iron-Rich Diet ?Iron is a mineral that helps your body produce hemoglobin. Hemoglobin is a protein in red blood cells that carries oxygen to your body's tissues. Eating too little iron may cause you to feel weak and tired, and it can increase your risk of infection. Iron is naturally found in many foods, and many foods have iron added to them (are iron-fortified). ?You may need to follow an iron-rich diet if you do not have enough iron in your body due to certain medical conditions. The amount of iron that you need each day depends on your age, your  sex, and any medical conditions you have. Follow instructions from your health care provider or a dietitian about how much iron you should eat each day. ?What are tips for following this plan? ?Reading food labels ?Check food labels to see how many milligrams (mg) of iron are in each serving. ?Cooking ?Cook foods in pots and pans that are made from iron. ?Take these steps to make it easier for your body to absorb iron from certain foods: ?Soak beans overnight before cooking. ?Soak whole grains overnight and drain them before  using. ?Ferment flours before baking, such as by using yeast in bread dough. ?Meal planning ?When you eat foods that contain iron, you should eat them with foods that are high in vitamin C. These include oranges, pep

## 2022-01-26 DIAGNOSIS — Z20822 Contact with and (suspected) exposure to covid-19: Secondary | ICD-10-CM | POA: Diagnosis not present

## 2022-02-04 ENCOUNTER — Telehealth: Payer: Self-pay | Admitting: Hematology and Oncology

## 2022-02-04 NOTE — Telephone Encounter (Signed)
Received notification that patient no showe her mammogram appt that was for today, 02/04/22. ? ?Contacted pt to ask if she'd like for [place] to R/S this appt but I was unable to reach or lvm. ?

## 2022-02-25 DIAGNOSIS — Z20822 Contact with and (suspected) exposure to covid-19: Secondary | ICD-10-CM | POA: Diagnosis not present

## 2022-03-02 DIAGNOSIS — E538 Deficiency of other specified B group vitamins: Secondary | ICD-10-CM | POA: Diagnosis not present

## 2022-03-02 DIAGNOSIS — D509 Iron deficiency anemia, unspecified: Secondary | ICD-10-CM | POA: Diagnosis not present

## 2022-03-02 DIAGNOSIS — Z6841 Body Mass Index (BMI) 40.0 and over, adult: Secondary | ICD-10-CM | POA: Diagnosis not present

## 2022-03-02 DIAGNOSIS — J45909 Unspecified asthma, uncomplicated: Secondary | ICD-10-CM | POA: Diagnosis not present

## 2022-03-02 DIAGNOSIS — E119 Type 2 diabetes mellitus without complications: Secondary | ICD-10-CM | POA: Diagnosis not present

## 2022-06-02 DIAGNOSIS — J45909 Unspecified asthma, uncomplicated: Secondary | ICD-10-CM | POA: Diagnosis not present

## 2022-06-02 DIAGNOSIS — M19042 Primary osteoarthritis, left hand: Secondary | ICD-10-CM | POA: Diagnosis not present

## 2022-06-02 DIAGNOSIS — K2270 Barrett's esophagus without dysplasia: Secondary | ICD-10-CM | POA: Diagnosis not present

## 2022-06-02 DIAGNOSIS — N952 Postmenopausal atrophic vaginitis: Secondary | ICD-10-CM | POA: Diagnosis not present

## 2022-06-02 DIAGNOSIS — D649 Anemia, unspecified: Secondary | ICD-10-CM | POA: Diagnosis not present

## 2022-06-02 DIAGNOSIS — F5101 Primary insomnia: Secondary | ICD-10-CM | POA: Diagnosis not present

## 2022-06-02 DIAGNOSIS — E119 Type 2 diabetes mellitus without complications: Secondary | ICD-10-CM | POA: Diagnosis not present

## 2022-06-02 DIAGNOSIS — M19041 Primary osteoarthritis, right hand: Secondary | ICD-10-CM | POA: Diagnosis not present

## 2022-06-02 DIAGNOSIS — M5441 Lumbago with sciatica, right side: Secondary | ICD-10-CM | POA: Diagnosis not present

## 2022-06-02 DIAGNOSIS — K2100 Gastro-esophageal reflux disease with esophagitis, without bleeding: Secondary | ICD-10-CM | POA: Diagnosis not present

## 2022-06-02 DIAGNOSIS — I10 Essential (primary) hypertension: Secondary | ICD-10-CM | POA: Diagnosis not present

## 2022-06-02 DIAGNOSIS — E538 Deficiency of other specified B group vitamins: Secondary | ICD-10-CM | POA: Diagnosis not present

## 2022-06-28 DIAGNOSIS — R1030 Lower abdominal pain, unspecified: Secondary | ICD-10-CM | POA: Diagnosis not present

## 2022-06-28 DIAGNOSIS — R0602 Shortness of breath: Secondary | ICD-10-CM | POA: Diagnosis not present

## 2022-06-28 DIAGNOSIS — D649 Anemia, unspecified: Secondary | ICD-10-CM | POA: Diagnosis not present

## 2022-07-01 DIAGNOSIS — R1310 Dysphagia, unspecified: Secondary | ICD-10-CM | POA: Diagnosis not present

## 2022-07-01 DIAGNOSIS — D509 Iron deficiency anemia, unspecified: Secondary | ICD-10-CM | POA: Diagnosis not present

## 2022-07-01 DIAGNOSIS — K2270 Barrett's esophagus without dysplasia: Secondary | ICD-10-CM | POA: Diagnosis not present

## 2022-07-01 DIAGNOSIS — R634 Abnormal weight loss: Secondary | ICD-10-CM | POA: Diagnosis not present

## 2022-07-02 ENCOUNTER — Ambulatory Visit: Payer: Medicare Other | Admitting: Hematology and Oncology

## 2022-07-02 ENCOUNTER — Other Ambulatory Visit: Payer: Medicare Other

## 2022-07-05 DIAGNOSIS — I493 Ventricular premature depolarization: Secondary | ICD-10-CM | POA: Diagnosis not present

## 2022-07-06 DIAGNOSIS — K229 Disease of esophagus, unspecified: Secondary | ICD-10-CM | POA: Diagnosis not present

## 2022-07-06 DIAGNOSIS — K219 Gastro-esophageal reflux disease without esophagitis: Secondary | ICD-10-CM | POA: Diagnosis not present

## 2022-07-06 DIAGNOSIS — R1013 Epigastric pain: Secondary | ICD-10-CM | POA: Diagnosis not present

## 2022-07-06 DIAGNOSIS — R1310 Dysphagia, unspecified: Secondary | ICD-10-CM | POA: Diagnosis not present

## 2022-07-06 DIAGNOSIS — K222 Esophageal obstruction: Secondary | ICD-10-CM | POA: Diagnosis not present

## 2022-07-06 DIAGNOSIS — K209 Esophagitis, unspecified without bleeding: Secondary | ICD-10-CM | POA: Diagnosis not present

## 2022-07-06 DIAGNOSIS — Z9884 Bariatric surgery status: Secondary | ICD-10-CM | POA: Diagnosis not present

## 2022-07-13 ENCOUNTER — Ambulatory Visit: Payer: Self-pay | Admitting: Licensed Clinical Social Worker

## 2022-07-13 NOTE — Patient Outreach (Signed)
  Care Coordination   07/13/2022 Name: Tammy Dickson MRN: [id number] DOB: Feb 06, 1946   Care Coordination Outreach Attempts:  An unsuccessful telephone outreach was attempted today to offer the patient information about available care coordination services as a benefit of their health plan.   Follow Up Plan:  Additional outreach attempts will be made to offer the patient care coordination information and services.   Encounter Outcome:  No Answer  Care Coordination Interventions Activated:  No   Care Coordination Interventions:  No, not indicated    Lenor Derrick, MSW  Social Worker [facility]  [phone]

## 2022-07-16 DIAGNOSIS — E538 Deficiency of other specified B group vitamins: Secondary | ICD-10-CM | POA: Diagnosis not present

## 2022-08-11 ENCOUNTER — Ambulatory Visit: Payer: Self-pay | Admitting: Licensed Clinical Social Worker

## 2022-08-11 NOTE — Patient Outreach (Signed)
  Care Coordination   Initial Visit Note   08/11/2022 Name: Tammy Dickson MRN: [id number] DOB: 1946/07/15  Tammy Dickson is a 76 y.o. year old female who sees Merry Lofty, NP-C for primary care. I spoke with  Jerry Caras by phone today.  What matters to the patients health and wellness today?  Patient declined services    Goals Addressed               This Visit's Progress     Care Coordination Activities- Patient declined (pt-stated)        SW completed SDOH screening. Patient declined any needs or barriers. SW educated patient on SW contact information and what SW can assist with.         SDOH assessments and interventions completed:  Yes     Care Coordination Interventions Activated:  Yes  Care Coordination Interventions:  Yes, provided   Follow up plan: No further intervention required.   Encounter Outcome:  Pt. Visit Completed

## 2022-08-11 NOTE — Patient Instructions (Signed)
Visit Information  Thank you for taking time to visit with me today. Please don't hesitate to contact me if I can be of assistance to you.   Following are the goals we discussed today:   Goals Addressed               This Visit's Progress     Care Coordination Activities- Patient declined (pt-stated)        SW completed SDOH screening. Patient declined any needs or barriers. SW educated patient on SW contact information and what SW can assist with.           Patient verbalizes understanding of instructions and care plan provided today and agrees to view in [place]. Active MyChart status and patient understanding of how to access instructions and care plan via MyChart confirmed with patient.     No further follow up required: .  Lenor Derrick, MSW  Social Worker [facility]  [phone]

## 2022-08-16 DIAGNOSIS — M25571 Pain in right ankle and joints of right foot: Secondary | ICD-10-CM | POA: Diagnosis not present

## 2022-09-09 DIAGNOSIS — R29898 Other symptoms and signs involving the musculoskeletal system: Secondary | ICD-10-CM | POA: Diagnosis not present

## 2022-09-09 DIAGNOSIS — S9001XS Contusion of right ankle, sequela: Secondary | ICD-10-CM | POA: Diagnosis not present

## 2022-09-09 DIAGNOSIS — M779 Enthesopathy, unspecified: Secondary | ICD-10-CM | POA: Diagnosis not present

## 2022-09-09 DIAGNOSIS — S99911A Unspecified injury of right ankle, initial encounter: Secondary | ICD-10-CM | POA: Diagnosis not present

## 2022-09-09 DIAGNOSIS — M1990 Unspecified osteoarthritis, unspecified site: Secondary | ICD-10-CM | POA: Diagnosis not present

## 2022-09-09 DIAGNOSIS — M25571 Pain in right ankle and joints of right foot: Secondary | ICD-10-CM | POA: Diagnosis not present

## 2022-09-30 DIAGNOSIS — S9001XD Contusion of right ankle, subsequent encounter: Secondary | ICD-10-CM | POA: Diagnosis not present

## 2022-09-30 DIAGNOSIS — M25571 Pain in right ankle and joints of right foot: Secondary | ICD-10-CM | POA: Diagnosis not present

## 2022-09-30 DIAGNOSIS — S99911D Unspecified injury of right ankle, subsequent encounter: Secondary | ICD-10-CM | POA: Diagnosis not present

## 2022-09-30 DIAGNOSIS — M779 Enthesopathy, unspecified: Secondary | ICD-10-CM | POA: Diagnosis not present

## 2022-09-30 DIAGNOSIS — S93401D Sprain of unspecified ligament of right ankle, subsequent encounter: Secondary | ICD-10-CM | POA: Diagnosis not present

## 2022-09-30 DIAGNOSIS — M19079 Primary osteoarthritis, unspecified ankle and foot: Secondary | ICD-10-CM | POA: Diagnosis not present
